# Patient Record
Sex: Male | Born: 1985 | Race: White | Hispanic: No | Marital: Married | Smoking: Never smoker
Health system: Southern US, Community
[De-identification: ages and names within clinical notes are randomized; demographics above are authoritative.]

## PROBLEM LIST (undated history)

## (undated) ENCOUNTER — Emergency Department (HOSPITAL_COMMUNITY)

## (undated) DIAGNOSIS — E781 Pure hyperglyceridemia: Secondary | ICD-10-CM

## (undated) DIAGNOSIS — E785 Hyperlipidemia, unspecified: Secondary | ICD-10-CM

## (undated) DIAGNOSIS — M109 Gout, unspecified: Secondary | ICD-10-CM

## (undated) HISTORY — PX: APPENDECTOMY: SHX54

## (undated) HISTORY — DX: Hyperlipidemia, unspecified: E78.5

## (undated) HISTORY — DX: Gout, unspecified: M10.9

## (undated) HISTORY — DX: Pure hyperglyceridemia: E78.1

---
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
## 2010-04-26 ENCOUNTER — Encounter: Admission: RE | Admit: 2010-04-26 | Discharge: 2010-04-26 | Payer: Self-pay | Admitting: Occupational Medicine

## 2010-05-27 ENCOUNTER — Encounter: Admission: RE | Admit: 2010-05-27 | Discharge: 2010-05-27 | Payer: Self-pay | Admitting: Occupational Medicine

## 2010-06-21 ENCOUNTER — Emergency Department (HOSPITAL_COMMUNITY): Admission: EM | Admit: 2010-06-21 | Discharge: 2010-06-21 | Payer: Self-pay | Admitting: Family Medicine

## 2013-03-13 ENCOUNTER — Telehealth: Payer: Self-pay

## 2013-03-13 NOTE — Telephone Encounter (Signed)
Pt states he seen Dr. Cleta Alberts about 4 years ago and was treated for Gout. He has recently has had a flare up again and is wanting to know if he could have something called in [facility] [address] Call back number is [phone]

## 2013-03-13 NOTE — Telephone Encounter (Signed)
Unfortunately can not do this without office visit. Have called him to advise.

## 2014-04-08 DIAGNOSIS — Y9301 Activity, walking, marching and hiking: Secondary | ICD-10-CM | POA: Insufficient documentation

## 2014-04-08 DIAGNOSIS — S9000XA Contusion of unspecified ankle, initial encounter: Secondary | ICD-10-CM | POA: Insufficient documentation

## 2014-04-08 DIAGNOSIS — S8990XA Unspecified injury of unspecified lower leg, initial encounter: Secondary | ICD-10-CM | POA: Insufficient documentation

## 2014-04-08 DIAGNOSIS — X500XXA Overexertion from strenuous movement or load, initial encounter: Secondary | ICD-10-CM | POA: Insufficient documentation

## 2014-04-08 DIAGNOSIS — S99929A Unspecified injury of unspecified foot, initial encounter: Principal | ICD-10-CM

## 2014-04-08 DIAGNOSIS — Y929 Unspecified place or not applicable: Secondary | ICD-10-CM | POA: Insufficient documentation

## 2014-04-08 DIAGNOSIS — S99919A Unspecified injury of unspecified ankle, initial encounter: Secondary | ICD-10-CM | POA: Insufficient documentation

## 2014-04-09 ENCOUNTER — Emergency Department (HOSPITAL_COMMUNITY): Payer: 59

## 2014-04-09 ENCOUNTER — Encounter (HOSPITAL_COMMUNITY): Payer: Self-pay | Admitting: Emergency Medicine

## 2014-04-09 ENCOUNTER — Emergency Department (HOSPITAL_COMMUNITY)
Admission: EM | Admit: 2014-04-09 | Discharge: 2014-04-09 | Disposition: A | Discharge status: Home / Self Care | Payer: 59 | Attending: Emergency Medicine | Admitting: Emergency Medicine

## 2014-04-09 DIAGNOSIS — M25572 Pain in left ankle and joints of left foot: Secondary | ICD-10-CM

## 2014-04-09 MED ORDER — HYDROCODONE-ACETAMINOPHEN 5-325 MG PO TABS
1.0000 | ORAL_TABLET | Freq: Once | ORAL | Status: AC
  Administered 2014-04-09: 1 via ORAL
  Filled 2014-04-09: qty 1

## 2014-04-09 NOTE — ED Notes (Signed)
Patient transported to X-ray 

## 2014-04-09 NOTE — ED Notes (Signed)
Pt reports that he stepped off a ramp and twisted L ankle. Ankle appears swollen and reports toes are starting to go numb. PMS intact.

## 2014-04-09 NOTE — ED Provider Notes (Signed)
CSN: [id number]     Arrival date & time 04/08/14  2330 History   First MD Initiated Contact with Patient 04/09/14 [phone]     Chief Complaint  Patient presents with  . Ankle Pain   HPI  Tyler Best is a 28 y.o. male with no PMH who presents to the ED for evaluation of ankle pain. History was provided by the patient. Patient states he was walking down the stairs and missed a step, resulting in the twisted of his left ankle this evening around 10 pm. Denies any other injuries or trauma. No head injury or LOC. Patient complains of constant left aching lateral ankle pain. Pain worse with movement. Nothing taken for pain PTA. Has had multiple left ankle injuries in the past. He complains of mild numbness but denies any weakness or loss of sensation.    History reviewed. No pertinent past medical history. Past Surgical History  Procedure Laterality Date  . Appendectomy     No family history on file. History  Substance Use Topics  . Smoking status: Never Smoker   . Smokeless tobacco: Not on file  . Alcohol Use: Yes    Review of Systems  Constitutional: Negative for fever, activity change and appetite change.  Cardiovascular: Negative for leg swelling.  Musculoskeletal: Positive for arthralgias, gait problem (due to pain) and joint swelling. Negative for back pain, myalgias and neck pain.  Skin: Positive for color change. Negative for wound.  Neurological: Positive for numbness. Negative for weakness, light-headedness and headaches.     Allergies  Review of patient's allergies indicates no known allergies.  Home Medications   Prior to Admission medications   Not on File   BP 122/73  Pulse 85  Temp(Src) 98.2 F (36.8 C) (Oral)  Resp 14  SpO2 100%  Filed Vitals:   04/09/14 0033 04/09/14 0225  BP: 122/73   Pulse: 67 85  Temp: 98.1 F (36.7 C) 98.2 F (36.8 C)  TempSrc: Oral Oral  Resp: 18 14  SpO2: 96% 100%    Physical Exam  Nursing note and vitals  reviewed. Constitutional: He is oriented to person, place, and time. He appears well-developed and well-nourished. No distress.  HENT:  Head: Normocephalic and atraumatic.  Right Ear: External ear normal.  Left Ear: External ear normal.  Mouth/Throat: Oropharynx is clear and moist.  Eyes: Conjunctivae are normal. Right eye exhibits no discharge. Left eye exhibits no discharge.  Neck: Normal range of motion. Neck supple.  Cardiovascular: Normal rate.   Dorsalis pedis pulses present and equal bilaterally  Pulmonary/Chest: Effort normal.  Abdominal: Soft.  Musculoskeletal: Normal range of motion. He exhibits edema and tenderness.       Feet:  Tenderness to palpation to the left lateral malleolus with associated edema and ecchymosis. No tenderness to palpation to the left foot, calf, or knee throughout. No wounds, lacerations or erythema. ROM limited in the ankle due to pain. ROM of the left digits intact.   Neurological: He is alert and oriented to person, place, and time.  Sensation intact in the LE bilaterally  Skin: Skin is warm and dry. He is not diaphoretic.     ED Course  Procedures (including critical care time) Labs Review Labs Reviewed - No data to display  Imaging Review Dg Ankle Complete Left  04/09/2014   CLINICAL DATA:  LEFT ankle pain and swelling, twist injury today  EXAM: LEFT ANKLE COMPLETE - 3+ VIEW  COMPARISON:  None  FINDINGS: Lateral soft tissue swelling  extending anteriorly.  Ankle mortise intact.  Corticated ossicle adjacent to tip of medial malleolus.  No acute fracture, dislocation, or bone destruction.  IMPRESSION: No acute osseous findings.   Electronically Signed   By: Ulyses SouthwardMark  Boles M.D.   On: 04/09/2014 02:02     EKG Interpretation None      MDM   Tyler Best is a 28 y.o. male with no PMH who presents to the ED for evaluation of ankle pain. Etiology of ankle pain possibly due to sprain. X-rays negative for fracture or malalignment. Patient  neurovascularly intact. Given ankle splint and crutches. RICE method discussed. Follow-up if symptoms not improving or worsening. Return precautions, discharge instructions, and follow-up was discussed with the patient before discharge.     There are no discharge medications for this patient.   Final impressions: 1. Ankle pain, left       Greer EeJessica Katlin Christia Domke PA-C            Jillyn LedgerJessica K Woodford Strege, PA-C 04/10/14 1250

## 2014-04-09 NOTE — Discharge Instructions (Signed)
Take vicodin for severe pain - Please be careful with this medication.  It can cause drowsiness.  Use caution while driving, operating machinery, drinking alcohol, or any other activities that may impair your physical or mental abilities.   Continue Ibuprofen  Use RICE method - see below Return to the emergency department if you develop any changing/worsening condition or any other concerns (please read additional information regarding your condition below)   Ankle Pain Ankle pain is a common symptom. The bones, cartilage, tendons, and muscles of the ankle joint perform a lot of work each day. The ankle joint holds your body weight and allows you to move around. Ankle pain can occur on either side or back of 1 or both ankles. Ankle pain may be sharp and burning or dull and aching. There may be tenderness, stiffness, redness, or warmth around the ankle. The pain occurs more often when a person walks or puts pressure on the ankle. CAUSES  There are many reasons ankle pain can develop. It is important to work with your caregiver to identify the cause since many conditions can impact the bones, cartilage, muscles, and tendons. Causes for ankle pain include:  Injury, including a break (fracture), sprain, or strain often due to a fall, sports, or a high-impact activity.  Swelling (inflammation) of a tendon (tendonitis).  Achilles tendon rupture.  Ankle instability after repeated sprains and strains.  Poor foot alignment.  Pressure on a nerve (tarsal tunnel syndrome).  Arthritis in the ankle or the lining of the ankle.  Crystal formation in the ankle (gout or pseudogout). DIAGNOSIS  A diagnosis is based on your medical history, your symptoms, results of your physical exam, and results of diagnostic tests. Diagnostic tests may include X-ray exams or a computerized magnetic scan (magnetic resonance imaging, MRI). TREATMENT  Treatment will depend on the cause of your ankle pain and may  include:  Keeping pressure off the ankle and limiting activities.  Using crutches or other walking support (a cane or brace).  Using rest, ice, compression, and elevation.  Participating in physical therapy or home exercises.  Wearing shoe inserts or special shoes.  Losing weight.  Taking medications to reduce pain or swelling or receiving an injection.  Undergoing surgery. HOME CARE INSTRUCTIONS   Only take over-the-counter or prescription medicines for pain, discomfort, or fever as directed by your caregiver.  Put ice on the injured area.  Put ice in a plastic bag.  Place a towel between your skin and the bag.  Leave the ice on for 15-20 minutes at a time, 03-04 times a day.  Keep your leg raised (elevated) when possible to lessen swelling.  Avoid activities that cause ankle pain.  Follow specific exercises as directed by your caregiver.  Record how often you have ankle pain, the location of the pain, and what it feels like. This information may be helpful to you and your caregiver.  Ask your caregiver about returning to work or sports and whether you should drive.  Follow up with your caregiver for further examination, therapy, or testing as directed. SEEK MEDICAL CARE IF:   Pain or swelling continues or worsens beyond 1 week.  You have an oral temperature above 102 F (38.9 C).  You are feeling unwell or have chills.  You are having an increasingly difficult time with walking.  You have loss of sensation or other new symptoms.  You have questions or concerns. MAKE SURE YOU:   Understand these instructions.  Will watch your  condition.  Will get help right away if you are not doing well or get worse. Document Released: 04/20/2010 Document Revised: 01/23/2012 Document Reviewed: 04/20/2010 [facility] Patient Information 2014 [place], [place].  RICE: Routine Care for Injuries The routine care of many injuries includes Rest, Ice, Compression, and Elevation  (RICE). HOME CARE INSTRUCTIONS  Rest is needed to allow your body to heal. Routine activities can usually be resumed when comfortable. Injured tendons and bones can take up to 6 weeks to heal. Tendons are the cord-like structures that attach muscle to bone.  Ice following an injury helps keep the swelling down and reduces pain.  Put ice in a plastic bag.  Place a towel between your skin and the bag.  Leave the ice on for 15-20 minutes, 03-04 times a day. Do this while awake, for the first 24 to 48 hours. After that, continue as directed by your caregiver.  Compression helps keep swelling down. It also gives support and helps with discomfort. If an elastic bandage has been applied, it should be removed and reapplied every 3 to 4 hours. It should not be applied tightly, but firmly enough to keep swelling down. Watch fingers or toes for swelling, bluish discoloration, coldness, numbness, or excessive pain. If any of these problems occur, remove the bandage and reapply loosely. Contact your caregiver if these problems continue.  Elevation helps reduce swelling and decreases pain. With extremities, such as the arms, hands, legs, and feet, the injured area should be placed near or above the level of the heart, if possible. SEEK IMMEDIATE MEDICAL CARE IF:  You have persistent pain and swelling.  You develop redness, numbness, or unexpected weakness.  Your symptoms are getting worse rather than improving after several days. These symptoms may indicate that further evaluation or further X-rays are needed. Sometimes, X-rays may not show a small broken bone (fracture) until 1 week or 10 days later. Make a follow-up appointment with your caregiver. Ask when your X-ray results will be ready. Make sure you get your X-ray results. Document Released: 02/12/2001 Document Revised: 01/23/2012 Document Reviewed: 04/01/2011 [facility] Patient Information 2014 [place], [place].   Emergency Department Resource  Guide 1) Find a Doctor and Pay Out of Pocket Although you won't have to find out who is covered by your insurance plan, it is a good idea to ask around and get recommendations. You will then need to call the office and see if the doctor you have chosen will accept you as a new patient and what types of options they offer for patients who are self-pay. Some doctors offer discounts or will set up payment plans for their patients who do not have insurance, but you will need to ask so you aren't surprised when you get to your appointment.  2) Contact Your Local Health Department Not all health departments have doctors that can see patients for sick visits, but many do, so it is worth a call to see if yours does. If you don't know where your local health department is, you can check in your phone book. The CDC also has a tool to help you locate your state's health department, and many state websites also have listings of all of their local health departments.  3) Find a Walk-in Clinic If your illness is not likely to be very severe or complicated, you may want to try a walk in clinic. These are popping up all over the country in pharmacies, drugstores, and shopping centers. They're usually staffed by nurse practitioners  or physician assistants that have been trained to treat common illnesses and complaints. They're usually fairly quick and inexpensive. However, if you have serious medical issues or chronic medical problems, these are probably not your best option.  No Primary Care Doctor: - Call Health Connect at  [phone] - they can help you locate a primary care doctor that  accepts your insurance, provides certain services, etc. - Physician Referral Service- [phone]  Chronic Pain Problems: Organization         Address  Phone   Notes  [facility]  [phone] Patients need to be referred by their primary care doctor.   Medication Assistance: Organization          Address  Phone   Notes  [facility] [address] [place] [place], [place] [address] [phone] --Must be a resident of [facility] -- Must have NO insurance coverage whatsoever (no Medicaid/ Medicare, etc.) -- The pt. MUST have a primary care doctor that directs their care regularly and follows them in the community   [facility]ist  [phone]   [employer]  [phone]    Agencies that provide inexpensive medical care: Organization         Address  Phone   Notes  [facility]  [phone]   [facility]    [phone]   [facility] [address] [place], [place] [address] [phone]   [facility] [address] [place] [place] [phone]   Planned Parenthood    [phone]   [facility]    [phone]   [facility]  [address], [place] Phone:  [phone], Fax:  [phone] Hours of Operation:  9 am - 6 pm, M-F.  Also accepts Medicaid/Medicare and self-pay.  [facility]  [address], [place] Phone: [phone], Fax: [phone]. Hours of Operation:  8:30 am - 5:30 pm, M-F.  Also accepts Medicaid and self-pay.  [facility] [place] [address] [place] Phone: [phone]   [facility] [address] Natasha Bence [place], [place] [phone], Ext. [phone] Mondays & Thursdays: 7-9 AM.  First 15 patients are seen on a first come, first serve basis.    Medicaid-accepting [facility] Providers:  Organization         Address  Phone   Notes  [facility] [address], [place] [phone] Also accepts self-pay patients.  [facility] [address] Laurell Josephs [place], [place]  [phone]   [facility] [address], [place] [phone]   [facility] [address], [place] [phone]   Renaye Rakers [address], [place]   [phone] Only accepts [place] Access [place] patients after they have their name applied to their card.   Self-Pay (no insurance) in [facility]:  Organization         Address  Phone   Notes  Sickle Cell Patients, [facility] [address] [place], [place] [phone]   [facility] [address] [place], [place] [phone]   [facility] [place]  [address], [place] [phone]   [facility]/Dr. Julio Sicks  [phone]  [address], [place] or [address], [place] [phone] Phone number for both [place] and [place] locations is the same.  [facility] [address], [place] [phone]) [phone]   [facility] [place] [address] [place] [address], [place] or [address] [phone]) [phone]   [facility] [address], [place] [phone]) [phone], phone; [phone](336) [phone], fax Sees patients 1st and 3rd Saturday of every month.  Must not qualify for public or private insurance (i.e. Medicaid, Medicare, [place] Health Choice, Veterans' Benefits)  Household income should be no more than 200% of the poverty level The clinic cannot treat you if you are pregnant or think you are pregnant  Sexually transmitted diseases are not treated at the clinic.    Dental Care: Organization         Address  Phone  Notes  [facility] [address], [place] [phone] Accepts children up to age 30 who are enrolled in [place]icaid or [place] Health Choice; pregnant women with a Medicaid card; and children who have applied for Medicaid or [place] Health Choice, but were declined, whose parents can pay a reduced fee at time of service.  [facility]  [place]  [address], [place] [phone] Accepts children up to age 30 who are enrolled in [place] or [place] Health Choice; pregnant women with a Medicaid card; and children who have applied for Medicaid or [place] Health Choice, but were declined, whose parents can pay a reduced fee at time of service.  [facility]  [address], [place] [phone]) [phone] Patients are seen by appointment only. Walk-ins are not accepted. [facility] will see patients 30 years of age and older. Monday - Tuesday (8am-5pm) Most Wednesdays (8:30-5pm) $30 per visit, cash only  [facility]  [address], [facility] [place] [phone]) [phone] Patients are seen by appointment only. Walk-ins are not accepted. [facility] will see patients 30 years of age and older. One Wednesday Evening (Monthly: Volunteer Based).  $30 per visit, cash only  [employer] [facility] [facility]  [phone] for adults; Children under age 444, call Graduate Pediatric Dentistry at [phone]. Children aged 884-14, please call [phone] to request a pediatric application.  Dental services are provided in all areas of dental care including fillings, crowns and bridges, complete and partial dentures, implants, gum treatment, root canals, and extractions. Preventive care is also provided. Treatment is provided to both adults and children. Patients are selected via a lottery and there is often a waiting list.   [facility] [address], [place]  [phone]) [phone] [email or web address]   [facility] [address], [place], [place] [phone], Ext. [phone] Second and Fourth Thursday of each month, opens at 6:30 AM; Clinic ends at 9 AM.  Patients are seen on a first-come first-served basis, and a limited number are seen during each clinic.   [facility]  [address], [place], [place] [phone]   Eligibility Requirements You must  have lived in [place], [place], or [place] counties for at least the last three months.   You cannot be eligible for state or federal sponsored National Cityhealthcare insurance, including CIGNAVeterans Administration, [place]icaid, or [employer]Medicare.   You generally cannot be eligible for healthcare insurance through your employer.  How to apply: Eligibility screenings are held every Tuesday and Wednesday afternoon from 1:00 pm until 4:00 pm. You do not need an appointment for the interview!  [facility] [address] [place], [place]   [place]  [place] Department  [place]  [phone]    Behavioral Health Resources in the Community: Intensive Outpatient Programs Organization         Address  Phone  Notes  [place]. [address], [place], [place] [phone]   [facility] Outpatient [address], [place], [place]   ADS: Alcohol & Drug Svcs [address], [place], [place]   [place] [address]  [place], [place] or [phone]   Substance Abuse Resources Organization         Address  Phone  Notes  Alcohol and Drug Services  [phone]   [place]  [phone]   The [place]   Chinita Pester  [phone]   Residential & Outpatient Substance Abuse Program  [phone]   Psychological Services Organization         Address  Phone  Notes  [facility]  [place]  [phone]   [place] [address], [place] or [phone]    Mobile Crisis Teams Organization         Address  Phone  Notes  [facility], Mobile Crisis Care Unit  [phone]   [facility]  [address] [place], [place]   [place] Levels [address], [place] [phone]    Self-Help/Support Groups Organization         Address  Phone             Notes  [place]. of [place] - variety of support groups  Amelia Call for more information  Narcotics Anonymous (NA), [facility] [address], [employer] [place]  2 meetings at this location   Special educational needs teacher         Address  Phone  Notes  [facility] Residential Treatment [place],    [place]  1-(747)[phone]   [facility]  [address] [place] [address]69, [place], [place]   [place], [place] [phone] Admissions: 8am-3pm M-F  Incentives Substance [place] [address],    [place], [place] [phone]   [facility] [address] [place], [place], [place]   [facility] [address]  [place], [place]   [facility] Programs - Intensive Outpatient [address] Kristeen [address], [place], [place]   [facility] [facility] [place] [place].,  [place], [place] [phone] or [phone]   [facility] (RTS) [address] [place], [place] Accepts Medicaid  [facility] [place] [address]  [place] [phone] Substance Abuse/Addiction Treatment   [facility] Organization         Address  Phone  Notes  [facility]  [phone]   Domenic Schwab, PhD [address] Arlis Porta [place], [place]   [phone] or [phone]   [place], [place] [phone]   [facility] [address],  [place], [place] [phone] Insurance/Medicaid/sponsorship through [facility] and Families [address], [address] [place], [place] [phone] [place] [address]    [place], [place] [phone]    Dr. Adele Schilder  [phone]   [facility] Dept. 1) [address], [place] 2) [place] 3)  [place] [address], [place] [phone]  [phone]   [place] [phone] or [phone] (After Hours)

## 2014-04-10 NOTE — ED Provider Notes (Signed)
Medical screening examination/treatment/procedure(s) were performed by non-physician practitioner and as supervising physician I was immediately available for consultation/collaboration.   EKG Interpretation None        Enid Skeens, MD 04/10/14 [phone]

## 2016-03-01 ENCOUNTER — Telehealth: Payer: Self-pay

## 2016-03-01 NOTE — Telephone Encounter (Signed)
Called pt, he had some questions about gout. I advised him to come in to be seen so we can evaluate. Pt agreed and will find PCP or walk-in the urgent care.

## 2016-03-01 NOTE — Telephone Encounter (Signed)
Patient states he was diagnosed with gout by Dr. Cleta Albertsaub several years ago and asked if he could speak to Dr. Cleta Albertsaub. I asked what in regards to and patient states that he just wants Dr. Cleta Albertsaub to call him. Please call! [phone]

## 2016-05-19 ENCOUNTER — Ambulatory Visit: Payer: Self-pay | Admitting: Family Medicine

## 2016-05-19 DIAGNOSIS — Z0289 Encounter for other administrative examinations: Secondary | ICD-10-CM

## 2017-06-24 DIAGNOSIS — S20211A Contusion of right front wall of thorax, initial encounter: Secondary | ICD-10-CM | POA: Diagnosis not present

## 2017-09-13 DIAGNOSIS — Z0183 Encounter for blood typing: Secondary | ICD-10-CM | POA: Diagnosis not present

## 2018-01-18 DIAGNOSIS — M19072 Primary osteoarthritis, left ankle and foot: Secondary | ICD-10-CM | POA: Diagnosis not present

## 2018-01-18 DIAGNOSIS — M109 Gout, unspecified: Secondary | ICD-10-CM | POA: Diagnosis not present

## 2018-01-18 DIAGNOSIS — M7752 Other enthesopathy of left foot: Secondary | ICD-10-CM | POA: Diagnosis not present

## 2018-02-08 ENCOUNTER — Encounter: Payer: Self-pay | Admitting: Family Medicine

## 2018-02-08 ENCOUNTER — Ambulatory Visit: Payer: 59 | Admitting: Family Medicine

## 2018-02-08 VITALS — BP 118/72 | HR 68 | Temp 98.0°F | Ht 68.75 in | Wt 197.0 lb

## 2018-02-08 DIAGNOSIS — Z8739 Personal history of other diseases of the musculoskeletal system and connective tissue: Secondary | ICD-10-CM | POA: Diagnosis not present

## 2018-02-08 DIAGNOSIS — Z7689 Persons encountering health services in other specified circumstances: Secondary | ICD-10-CM | POA: Diagnosis not present

## 2018-02-08 MED ORDER — ALLOPURINOL 100 MG PO TABS: 100.0000 mg | ORAL_TABLET | Freq: Every day | ORAL | 0 refills | Status: DC

## 2018-02-08 NOTE — Progress Notes (Signed)
Patient presents to clinic today to establish care.  SUBJECTIVE: PMH: Patient is a 32 year old male with past medical history significant for gout.  In the past patient has used urgent cares for his health needs.  Patient states he just had a physical a few months ago.  Gout: -Patient endorses ongoing issues with gout typically affecting bilateral great toes. -Patient was seen at [facility] by Dr. Elijah Birk for his last gout flare. -At that time uric acid level was drawn and patient was given allopurinol 100 mg daily. -Pt was also seen by the city physician and advised to find a primary care provider. -Pt endorses flares maybe 3-4 times per year.  Occasionally a flare is so bad that he cannot walk. -Pt is concerned about having a flare as he is expecting a baby in May.  Pt is adamant that he does not have any flares. -Pt endorses drinking beer on a regular basis and eating red meat.  Allergies: NKDA  Past surgical history: Wisdom tooth extraction Appendectomy 1997  Social history: Patient is married.  He and his wife on May 2019.  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  Patient endorses using chewing tobacco 3-4 times per day.  Large can will last a few days.  Patient thought about quitting.  Patient endorses alcohol use.  Patient states he will not stop drinking beer.  Patient denies drug use.  Family medical history: Dad-DM MGF-lung cancer with metastasis   Health Maintenance: Dental --Dr. [facility] [place] [facility] Immunizations --influenza vaccine 2018  History reviewed. No pertinent past medical history.  Past Surgical History:  Procedure Laterality Date  . APPENDECTOMY      Current Outpatient Medications on File Prior to Visit  Medication Sig Dispense Refill  . allopurinol (ZYLOPRIM) 100 MG tablet Take 100 mg by mouth daily.     No current facility-administered medications on file prior to visit.      No Known Allergies  Family History  Problem Relation Age of Onset  . Diabetes Father   . Cancer Maternal Grandfather     Social History   Socioeconomic History  . Marital status: Married    Spouse name: Not on file  . Number of children: Not on file  . Years of education: Not on file  . Highest education level: Not on file  Occupational History  . Not on file  Social Needs  . Financial resource strain: Not on file  . Food insecurity:    Worry: Not on file    Inability: Not on file  . Transportation needs:    Medical: Not on file    Non-medical: Not on file  Tobacco Use  . Smoking status: Never Smoker  . Smokeless tobacco: Current User    Types: Snuff  Substance and Sexual Activity  . Alcohol use: Yes    Alcohol/week: 3.6 oz    Types: 6 Cans of beer per week  . Drug use: No  . Sexual activity: Yes    Birth control/protection: None  Lifestyle  . Physical activity:    Days per week: Not on file    Minutes per session: Not on file  . Stress: Not on file  Relationships  . Social connections:    Talks on phone: Not on file    Gets together: Not on file    Attends religious service: Not on file    Active member of club or organization: Not  on file    Attends meetings of clubs or organizations: Not on file    Relationship status: Not on file  . Intimate partner violence:    Fear of current or ex partner: Not on file    Emotionally abused: Not on file    Physically abused: Not on file    Forced sexual activity: Not on file  Other Topics Concern  . Not on file  Social History Narrative  . Not on file    ROS General: Denies fever, chills, night sweats, changes in weight, changes in appetite HEENT: Denies headaches, ear pain, changes in vision, rhinorrhea, sore throat CV: Denies CP, palpitations, SOB, orthopnea Pulm: Denies SOB, cough, wheezing GI: Denies abdominal pain, nausea, vomiting, diarrhea, constipation GU: Denies dysuria, hematuria, frequency,  vaginal discharge Msk: Denies muscle cramps, joint pains  +h/o gout Neuro: Denies weakness, numbness, tingling Skin: Denies rashes, bruising Psych: Denies depression, anxiety, hallucinations  BP 118/72 (BP Location: Left Arm, Patient Position: Sitting, Cuff Size: Normal)   Pulse 68   Temp 98 F (36.7 C) (Oral)   Ht 5' 8.75" (1.746 m)   Wt 197 lb (89.4 kg)   SpO2 98%   BMI 29.30 kg/m   Physical Exam Gen. Pleasant, well developed, well-nourished, in NAD HEENT - [place]/AT, PERRL, no scleral icterus, no nasal drainage, pharynx without erythema or exudate.  TMs normal bilaterally.  No cervical lymphadenopathy. Lungs: no use of accessory muscles, CTAB, no wheezes, rales or rhonchi Cardiovascular: RRR,  No r/g/m, no peripheral edema Abdomen: BS present, soft, nontender,nondistended Neuro:  A&Ox3, CN II-XII intact, normal gait Skin:  Warm, dry, intact, no lesions Psych: normal affect, mood appropriate  No results found for this or any previous visit (from the past 2160 hour(s)).  Assessment/Plan: History of gout  -Patient given a list of purine rich foods to avoid.  Patient refuses to stop drinking beer -Given handout on gout -Continue allopurinol daily - Plan: allopurinol (ZYLOPRIM) 100 MG tablet -Will obtain follow-up uric acid level in the next few months  Encounter to establish care -We reviewed the PMH, PSH, FH, SH, Meds and Allergies. -We provided refills for any medications we will prescribe as needed. -We addressed current concerns per orders and patient instructions. -We have asked for records for pertinent exams, studies, vaccines and notes from previous providers. -We have advised patient to follow up per instructions below.   Follow-up PRN in the next few months  Abbe AmsterdamShannon Reeanna Acri, MD

## 2018-02-08 NOTE — Patient Instructions (Signed)
Gout Gout is painful swelling that can occur in some of your joints. Gout is a type of arthritis. This condition is caused by having too much uric acid in your body. Uric acid is a chemical that forms when your body breaks down substances called purines. Purines are important for building body proteins. When your body has too much uric acid, sharp crystals can form and build up inside your joints. This causes pain and swelling. Gout attacks can happen quickly and be very painful (acute gout). Over time, the attacks can affect more joints and become more frequent (chronic gout). Gout can also cause uric acid to build up under your skin and inside your kidneys. What are the causes? This condition is caused by too much uric acid in your blood. This can occur because:  Your kidneys do not remove enough uric acid from your blood. This is the most common cause.  Your body makes too much uric acid. This can occur with some cancers and cancer treatments. It can also occur if your body is breaking down too many red blood cells (hemolytic anemia).  You eat too many foods that are high in purines. These foods include organ meats and some seafood. Alcohol, especially beer, is also high in purines.  A gout attack may be triggered by trauma or stress. What increases the risk? This condition is more likely to develop in people who:  Have a family history of gout.  Are male and middle-aged.  Are male and have gone through menopause.  Are obese.  Frequently drink alcohol, especially beer.  Are dehydrated.  Lose weight too quickly.  Have an organ transplant.  Have lead poisoning.  Take certain medicines, including aspirin, cyclosporine, diuretics, levodopa, and niacin.  Have kidney disease or psoriasis.  What are the signs or symptoms? An attack of acute gout happens quickly. It usually occurs in just one joint. The most common place is the big toe. Attacks often start at night. Other joints  that may be affected include joints of the feet, ankle, knee, fingers, wrist, or elbow. Symptoms may include:  Severe pain.  Warmth.  Swelling.  Stiffness.  Tenderness. The affected joint may be very painful to touch.  Shiny, red, or purple skin.  Chills and fever.  Chronic gout may cause symptoms more frequently. More joints may be involved. You may also have white or yellow lumps (tophi) on your hands or feet or in other areas near your joints. How is this diagnosed? This condition is diagnosed based on your symptoms, medical history, and physical exam. You may have tests, such as:  Blood tests to measure uric acid levels.  Removal of joint fluid with a needle (aspiration) to look for uric acid crystals.  X-rays to look for joint damage.  How is this treated? Treatment for this condition has two phases: treating an acute attack and preventing future attacks. Acute gout treatment may include medicines to reduce pain and swelling, including:  NSAIDs.  Steroids. These are strong anti-inflammatory medicines that can be taken by mouth (orally) or injected into a joint.  Colchicine. This medicine relieves pain and swelling when it is taken soon after an attack. It can be given orally or through an IV tube.  Preventive treatment may include:  Daily use of smaller doses of NSAIDs or colchicine.  Use of a medicine that reduces uric acid levels in your blood.  Changes to your diet. You may need to see a specialist about healthy eating (dietitian).    Follow these instructions at home: During a Gout Attack  If directed, apply ice to the affected area: ? Put ice in a plastic bag. ? Place a towel between your skin and the bag. ? Leave the ice on for 20 minutes, 2-3 times a day.  Rest the joint as much as possible. If the affected joint is in your leg, you may be given crutches to use.  Raise (elevate) the affected joint above the level of your heart as often as  possible.  Drink enough fluids to keep your urine clear or pale yellow.  Take over-the-counter and prescription medicines only as told by your health care provider.  Do not drive or operate heavy machinery while taking prescription pain medicine.  Follow instructions from your health care provider about eating or drinking restrictions.  Return to your normal activities as told by your health care provider. Ask your health care provider what activities are safe for you. Avoiding Future Gout Attacks  Follow a low-purine diet as told by your dietitian or health care provider. Avoid foods and drinks that are high in purines, including liver, kidney, anchovies, asparagus, herring, mushrooms, mussels, and beer.  Limit alcohol intake to no more than 1 drink a day for nonpregnant women and 2 drinks a day for men. One drink equals 12 oz of beer, 5 oz of wine, or 1 oz of hard liquor.  Maintain a healthy weight or lose weight if you are overweight. If you want to lose weight, talk with your health care provider. It is important that you do not lose weight too quickly.  Start or maintain an exercise program as told by your health care provider.  Drink enough fluids to keep your urine clear or pale yellow.  Take over-the-counter and prescription medicines only as told by your health care provider.  Keep all follow-up visits as told by your health care provider. This is important. Contact a health care provider if:  You have another gout attack.  You continue to have symptoms of a gout attack after10 days of treatment.  You have side effects from your medicines.  You have chills or a fever.  You have burning pain when you urinate.  You have pain in your lower back or belly. Get help right away if:  You have severe or uncontrolled pain.  You cannot urinate. This information is not intended to replace advice given to you by your health care provider. Make sure you discuss any questions  you have with your health care provider. Document Released: 10/28/2000 Document Revised: 04/07/2016 Document Reviewed: 08/13/2015 Elsevier Interactive Patient Education  2018 Elsevier Inc. Low-Purine Diet Purines are compounds that affect the level of uric acid in your body. A low-purine diet is a diet that is low in purines. Eating a low-purine diet can prevent the level of uric acid in your body from getting too high and causing gout or kidney stones or both. What do I need to know about this diet?  Choose low-purine foods. Examples of low-purine foods are listed in the next section.  Drink plenty of fluids, especially water. Fluids can help remove uric acid from your body. Try to drink 8-16 cups (1.9-3.8 L) a day.  Limit foods high in fat, especially saturated fat, as fat makes it harder for the body to get rid of uric acid. Foods high in saturated fat include pizza, cheese, ice cream, whole milk, fried foods, and gravies. Choose foods that are lower in fat and lean sources   of protein. Use olive oil when cooking as it contains healthy fats that are not high in saturated fat.  Limit alcohol. Alcohol interferes with the elimination of uric acid from your body. If you are having a gout attack, avoid all alcohol.  Keep in mind that different people's bodies react differently to different foods. You will probably learn over time which foods do or do not affect you. If you discover that a food tends to cause your gout to flare up, avoid eating that food. You can more freely enjoy foods that do not cause problems. If you have any questions about a food item, talk to your dietitian or health care provider. Which foods are low, moderate, and high in purines? The following is a list of foods that are low, moderate, and high in purines. You can eat any amount of the foods that are low in purines. You may be able to have small amounts of foods that are moderate in purines. Ask your health care provider how  much of a food moderate in purines you can have. Avoid foods high in purines. Grains  Foods low in purines: Enriched white bread, pasta, rice, cake, cornbread, popcorn.  Foods moderate in purines: Whole-grain breads and cereals, wheat germ, bran, oatmeal. Uncooked oatmeal. Dry wheat bran or wheat germ.  Foods high in purines: Pancakes, French toast, biscuits, muffins. Vegetables  Foods low in purines: All vegetables, except those that are moderate in purines.  Foods moderate in purines: Asparagus, cauliflower, spinach, mushrooms, green peas. Fruits  All fruits are low in purines. Meats and other Protein Foods  Foods low in purines: Eggs, nuts, peanut butter.  Foods moderate in purines: 80-90% lean beef, lamb, veal, pork, poultry, fish, eggs, peanut butter, nuts. Crab, lobster, oysters, and shrimp. Cooked dried beans, peas, and lentils.  Foods high in purines: Anchovies, sardines, herring, mussels, tuna, codfish, scallops, trout, and haddock. Bacon. Organ meats (such as liver or kidney). [place]. Game meat. Goose. Sweetbreads. Dairy  All dairy foods are low in purines. Low-fat and fat-free dairy products are best because they are low in saturated fat. Beverages  Drinks low in purines: Water, carbonated beverages, tea, coffee, cocoa.  Drinks moderate in purines: Soft drinks and other drinks sweetened with high-fructose corn syrup. Juices. To find whether a food or drink is sweetened with high-fructose corn syrup, look at the ingredients list.  Drinks high in purines: Alcoholic beverages (such as beer). Condiments  Foods low in purines: Salt, herbs, olives, pickles, relishes, vinegar.  Foods moderate in purines: Butter, margarine, oils, mayonnaise. Fats and Oils  Foods low in purines: All types, except gravies and sauces made with meat.  Foods high in purines: Gravies and sauces made with meat. Other Foods  Foods low in purines: Sugars, sweets, gelatin. Cake. Soups made  without meat.  Foods moderate in purines: Meat-based or fish-based soups, broths, or bouillons. Foods and drinks sweetened with high-fructose corn syrup.  Foods high in purines: High-fat desserts (such as ice cream, cookies, cakes, pies, doughnuts, and chocolate). Contact your dietitian for more information on foods that are not listed here. This information is not intended to replace advice given to you by your health care provider. Make sure you discuss any questions you have with your health care provider. Document Released: 02/25/2011 Document Revised: 04/07/2016 Document Reviewed: 10/07/2013 Elsevier Interactive Patient Education  2017 Elsevier Inc.  

## 2018-02-27 DIAGNOSIS — S61209A Unspecified open wound of unspecified finger without damage to nail, initial encounter: Secondary | ICD-10-CM | POA: Diagnosis not present

## 2018-02-27 DIAGNOSIS — M79644 Pain in right finger(s): Secondary | ICD-10-CM | POA: Diagnosis not present

## 2018-05-09 ENCOUNTER — Ambulatory Visit: Payer: 59 | Admitting: Family Medicine

## 2018-05-09 DIAGNOSIS — Z0289 Encounter for other administrative examinations: Secondary | ICD-10-CM

## 2018-08-13 ENCOUNTER — Other Ambulatory Visit: Payer: Self-pay | Admitting: Family Medicine

## 2018-08-13 DIAGNOSIS — Z8739 Personal history of other diseases of the musculoskeletal system and connective tissue: Secondary | ICD-10-CM

## 2019-03-15 ENCOUNTER — Other Ambulatory Visit: Payer: Self-pay

## 2019-03-15 ENCOUNTER — Ambulatory Visit (INDEPENDENT_AMBULATORY_CARE_PROVIDER_SITE_OTHER): Payer: 59 | Admitting: Family Medicine

## 2019-03-15 ENCOUNTER — Encounter: Payer: Self-pay | Admitting: Family Medicine

## 2019-03-15 DIAGNOSIS — E7800 Pure hypercholesterolemia, unspecified: Secondary | ICD-10-CM | POA: Diagnosis not present

## 2019-03-15 DIAGNOSIS — R945 Abnormal results of liver function studies: Secondary | ICD-10-CM | POA: Diagnosis not present

## 2019-03-15 DIAGNOSIS — R7989 Other specified abnormal findings of blood chemistry: Secondary | ICD-10-CM

## 2019-03-15 NOTE — Progress Notes (Signed)
Virtual Visit via Video Note  I connected with Tyler Best on 03/15/19 at  8:00 AM EDT by a video enabled telemedicine application and verified that I am speaking with the correct person using two identifiers.  Location patient: home Location provider:work or home office Persons participating in the virtual visit: patient, provider  I discussed the limitations of evaluation and management by telemedicine and the availability of in person appointments. The patient expressed understanding and agreed to proceed.   HPI: Pt works for the [employer]. He had labs 01/03/19 for annual work physical.  Pt's total cholesterol was 242 and LDL 153.  AST was 51 and ALT 84.  Pt worried about his liver function.  Pt denies abd pain, N/V, jaundice, fatigue, itching.  Pt may drink 5-6 beers on his days off.  Pt occasionally eats healthy.  Pt denies changes in meds, taking allopurinol daily for gout and may take OTC heartburn med. Denies tylenol use.   ROS: See pertinent positives and negatives per HPI.  No past medical history on file.  Past Surgical History:  Procedure Laterality Date  . APPENDECTOMY      Family History  Problem Relation Age of Onset  . Diabetes Father   . Cancer Maternal Grandfather     SOCIAL HX: Pt is married. Pt also works at [employer] when not at the [employer].  Pt has a 1 yo son.  Pt is currently on vacation from the [employer].  May go to the beach next wk.  Pt denies drug use.   Current Outpatient Medications:  .  allopurinol (ZYLOPRIM) 100 MG tablet, Take 100 mg by mouth daily., Disp: , Rfl:  .  allopurinol (ZYLOPRIM) 100 MG tablet, TAKE 1 TABLET BY MOUTH EVERY DAY, Disp: 30 tablet, Rfl: 2  EXAM:  VITALS per patient if applicable: RR between 12-20 bpm  GENERAL: alert, oriented, appears well and in no acute distress  HEENT: atraumatic, conjunctiva clear, no obvious abnormalities on inspection of external nose and ears  NECK: normal movements of the  head and neck  LUNGS: on inspection no signs of respiratory distress, breathing rate appears normal, no obvious gross SOB, gasping or wheezing  CV: no obvious cyanosis  MS: moves all visible extremities without noticeable abnormality  PSYCH/NEURO: pleasant and cooperative, no obvious depression or anxiety, speech and thought processing grossly intact  ASSESSMENT AND PLAN:  Discussed the following assessment and plan:  Elevated LFTs  -discussed various causes -pt strongly encouraged to decrease EtOH intake.   -diet changes also encouraged -will recheck CMP.  Pt to have labs done next Tues or Wed.  If LFTs remain elevated will obtain hepatic u/s. -continue to monitor - Plan: Comprehensive metabolic panel  Elevated cholesterol -discussed lifestyle modifications -recheck in 6 months    F/u prn based on labs.   I discussed the assessment and treatment plan with the patient. The patient was provided an opportunity to ask questions and all were answered. The patient agreed with the plan and demonstrated an understanding of the instructions.   The patient was advised to call back or seek an in-person evaluation if the symptoms worsen or if the condition fails to improve as anticipated.   Deeann Saint, MD

## 2019-03-20 ENCOUNTER — Other Ambulatory Visit (INDEPENDENT_AMBULATORY_CARE_PROVIDER_SITE_OTHER): Payer: 59

## 2019-03-20 ENCOUNTER — Other Ambulatory Visit: Payer: Self-pay | Admitting: Family Medicine

## 2019-03-20 ENCOUNTER — Other Ambulatory Visit: Payer: Self-pay

## 2019-03-20 DIAGNOSIS — R945 Abnormal results of liver function studies: Secondary | ICD-10-CM

## 2019-03-20 DIAGNOSIS — R7989 Other specified abnormal findings of blood chemistry: Secondary | ICD-10-CM

## 2019-03-20 LAB — COMPREHENSIVE METABOLIC PANEL
ALT: 97 U/L — ABNORMAL HIGH (ref 0–53)
AST: 68 U/L — ABNORMAL HIGH (ref 0–37)
Albumin: 4.7 g/dL (ref 3.5–5.2)
Alkaline Phosphatase: 70 U/L (ref 39–117)
BUN: 15 mg/dL (ref 6–23)
CO2: 28 mEq/L (ref 19–32)
Calcium: 9.3 mg/dL (ref 8.4–10.5)
Chloride: 96 mEq/L (ref 96–112)
Creatinine, Ser: 1.23 mg/dL (ref 0.40–1.50)
GFR: 66.85 mL/min (ref >60.00)
Glucose, Bld: 83 mg/dL (ref 70–99)
Potassium: 3.9 mEq/L (ref 3.5–5.1)
Sodium: 135 mEq/L (ref 135–145)
Total Bilirubin: 1 mg/dL (ref 0.2–1.2)
Total Protein: 7.1 g/dL (ref 6.0–8.3)

## 2019-04-11 ENCOUNTER — Ambulatory Visit
Admission: RE | Admit: 2019-04-11 | Discharge: 2019-04-11 | Disposition: A | Discharge status: Home / Self Care | Payer: 59 | Source: Ambulatory Visit | Attending: Family Medicine | Admitting: Family Medicine

## 2019-04-11 DIAGNOSIS — R7989 Other specified abnormal findings of blood chemistry: Secondary | ICD-10-CM

## 2019-04-16 ENCOUNTER — Telehealth: Payer: Self-pay

## 2019-04-16 NOTE — Telephone Encounter (Signed)
Pt was given U/S results and verbalized understanding.  Copied from CRM [id number]. Topic: General - Inquiry >> Apr 12, 2019  2:47 PM Baldo Daub L wrote: Reason for CRM:   Pt calling to find out results of ultrasound done yesterday. Pt can be reached at [phone]

## 2020-04-08 ENCOUNTER — Telehealth: Payer: Self-pay | Admitting: Family Medicine

## 2020-04-08 NOTE — Telephone Encounter (Signed)
Pt states that he has gout in both his feet and that he has spoken to Dr. Salomon Fick before about gout so she prescribed him allopurinol for it. He is wondering if it can be refilled. He stated he has a kid at the age of [age] and is unable to wait. Pt states it is urgent.   Last OV: 03/15/2019 I tried to offer the pt an appt for tomorrow but pt declined and wants to send the message back and speak to either CMA/PCP today. I informed the pt that I cannot guarantee he will received a call back today or that she will refill the prescription without being seen.   Pharmacy: [facility] [address] FAX: [phone]   Pt can be reached at [phone]

## 2020-04-09 NOTE — Telephone Encounter (Signed)
Please advise if ok to send refills since pt declined office appointment for his concerns.

## 2020-04-10 ENCOUNTER — Other Ambulatory Visit: Payer: Self-pay | Admitting: Family Medicine

## 2020-04-10 DIAGNOSIS — M109 Gout, unspecified: Secondary | ICD-10-CM

## 2020-04-10 MED ORDER — PREDNISONE 10 MG PO TABS: ORAL_TABLET | ORAL | 0 refills | Status: DC

## 2020-04-10 NOTE — Telephone Encounter (Signed)
Rx for prednisone sent to patient's pharmacy.  In the future patient needs to be seen for all other requests or issues.

## 2020-04-10 NOTE — Telephone Encounter (Signed)
Left a detailed message for pt regarding new Rx for Prednisone and Dr Salomon Fick recommendation

## 2020-04-14 ENCOUNTER — Other Ambulatory Visit: Payer: Self-pay

## 2020-04-14 ENCOUNTER — Encounter: Payer: Self-pay | Admitting: Family Medicine

## 2020-04-14 ENCOUNTER — Ambulatory Visit (INDEPENDENT_AMBULATORY_CARE_PROVIDER_SITE_OTHER): Payer: 59 | Admitting: Family Medicine

## 2020-04-14 DIAGNOSIS — M109 Gout, unspecified: Secondary | ICD-10-CM | POA: Insufficient documentation

## 2020-04-14 DIAGNOSIS — M1A0790 Idiopathic chronic gout, unspecified ankle and foot, without tophus (tophi): Secondary | ICD-10-CM | POA: Diagnosis not present

## 2020-04-14 MED ORDER — COLCHICINE 0.6 MG PO TABS: 0.6000 mg | ORAL_TABLET | Freq: Two times a day (BID) | ORAL | 1 refills | Status: DC | PRN

## 2020-04-14 MED ORDER — PREDNISONE 20 MG PO TABS: ORAL_TABLET | ORAL | 0 refills | Status: DC

## 2020-04-14 NOTE — Patient Instructions (Signed)
Gout  Gout is a condition that causes painful swelling of the joints. Gout is a type of inflammation of the joints (arthritis). This condition is caused by having too much uric acid in the body. Uric acid is a chemical that forms when the body breaks down substances called purines. Purines are important for building body proteins. When the body has too much uric acid, sharp crystals can form and build up inside the joints. This causes pain and swelling. Gout attacks can happen quickly and may be very painful (acute gout). Over time, the attacks can affect more joints and become more frequent (chronic gout). Gout can also cause uric acid to build up under the skin and inside the kidneys. What are the causes? This condition is caused by too much uric acid in your blood. This can happen because:  Your kidneys do not remove enough uric acid from your blood. This is the most common cause.  Your body makes too much uric acid. This can happen with some cancers and cancer treatments. It can also occur if your body is breaking down too many red blood cells (hemolytic anemia).  You eat too many foods that are high in purines. These foods include organ meats and some seafood. Alcohol, especially beer, is also high in purines. A gout attack may be triggered by trauma or stress. What increases the risk? You are more likely to develop this condition if you:  Have a family history of gout.  Are male and middle-aged.  Are male and have gone through menopause.  Are obese.  Frequently drink alcohol, especially beer.  Are dehydrated.  Lose weight too quickly.  Have an organ transplant.  Have lead poisoning.  Take certain medicines, including aspirin, cyclosporine, diuretics, levodopa, and niacin.  Have kidney disease.  Have a skin condition called psoriasis. What are the signs or symptoms? An attack of acute gout happens quickly. It usually occurs in just one joint. The most common place is  the big toe. Attacks often start at night. Other joints that may be affected include joints of the feet, ankle, knee, fingers, wrist, or elbow. Symptoms of this condition may include:  Severe pain.  Warmth.  Swelling.  Stiffness.  Tenderness. The affected joint may be very painful to touch.  Shiny, red, or purple skin.  Chills and fever. Chronic gout may cause symptoms more frequently. More joints may be involved. You may also have white or yellow lumps (tophi) on your hands or feet or in other areas near your joints. How is this diagnosed? This condition is diagnosed based on your symptoms, medical history, and physical exam. You may have tests, such as:  Blood tests to measure uric acid levels.  Removal of joint fluid with a thin needle (aspiration) to look for uric acid crystals.  X-rays to look for joint damage. How is this treated? Treatment for this condition has two phases: treating an acute attack and preventing future attacks. Acute gout treatment may include medicines to reduce pain and swelling, including:  NSAIDs.  Steroids. These are strong anti-inflammatory medicines that can be taken by mouth (orally) or injected into a joint.  Colchicine. This medicine relieves pain and swelling when it is taken soon after an attack. It can be given by mouth or through an IV. Preventive treatment may include:  Daily use of smaller doses of NSAIDs or colchicine.  Use of a medicine that reduces uric acid levels in your blood.  Changes to your diet. You may   need to see a dietitian about what to eat and drink to prevent gout. Follow these instructions at home: During a gout attack   If directed, put ice on the affected area: ? Put ice in a plastic bag. ? Place a towel between your skin and the bag. ? Leave the ice on for 20 minutes, 2-3 times a day.  Raise (elevate) the affected joint above the level of your heart as often as possible.  Rest the joint as much as possible.  If the affected joint is in your leg, you may be given crutches to use.  Follow instructions from your health care provider about eating or drinking restrictions. Avoiding future gout attacks  Follow a low-purine diet as told by your dietitian or health care provider. Avoid foods and drinks that are high in purines, including liver, kidney, anchovies, asparagus, herring, mushrooms, mussels, and beer.  Maintain a healthy weight or lose weight if you are overweight. If you want to lose weight, talk with your health care provider. It is important that you do not lose weight too quickly.  Start or maintain an exercise program as told by your health care provider. Eating and drinking  Drink enough fluids to keep your urine pale yellow.  If you drink alcohol: ? Limit how much you use to:  0-1 drink a day for women.  0-2 drinks a day for men. ? Be aware of how much alcohol is in your drink. In the [place] one drink equals one 12 oz bottle of beer (355 mL) one 5 oz glass of wine (148 mL), or one 1 oz glass of hard liquor (44 mL). General instructions  Take over-the-counter and prescription medicines only as told by your health care provider.  Do not drive or use heavy machinery while taking prescription pain medicine.  Return to your normal activities as told by your health care provider. Ask your health care provider what activities are safe for you.  Keep all follow-up visits as told by your health care provider. This is important. Contact a health care provider if you have:  Another gout attack.  Continuing symptoms of a gout attack after 10 days of treatment.  Side effects from your medicines.  Chills or a fever.  Burning pain when you urinate.  Pain in your lower back or belly. Get help right away if you:  Have severe or uncontrolled pain.  Cannot urinate. Summary  Gout is painful swelling of the joints caused by inflammation.  The most common site of pain is the big  toe, but it can affect other joints in the body.  Medicines and dietary changes can help to prevent and treat gout attacks. This information is not intended to replace advice given to you by your health care provider. Make sure you discuss any questions you have with your health care provider. Document Revised: 05/23/2018 Document Reviewed: 05/23/2018 Elsevier Patient Education  2020 Elsevier Inc.  

## 2020-04-14 NOTE — Progress Notes (Signed)
  Subjective:     Patient ID: Tyler Best, male   DOB: Jul 21, 1986, 34 y.o.   MRN: [id number]  HPI   Ian Malkin has history of gout since around age 59.  He states he had involvement of the MTP joints of the feet in the past.  He started Friday with bilateral involvement.  No recent dietary changes.  He stays well-hydrated.  He was called in some prednisone starting at 40 mg daily on Friday and is currently on 30 mg dose.  He is not seen much improvement yet.  He is not aware of any clear dietary triggers.  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  No recent fevers or chills.  He apparently briefly took allopurinol for prophylaxis in the past but not currently  No past medical history on file. Past Surgical History:  Procedure Laterality Date  . APPENDECTOMY      reports that he has never smoked. His smokeless tobacco use includes snuff. He reports current alcohol use of about 6.0 standard drinks of alcohol per week. He reports that he does not use drugs. family history includes Cancer in his maternal grandfather; Diabetes in his father. No Known Allergies   Review of Systems  Constitutional: Negative for chills and fever.  Respiratory: Negative for cough and shortness of breath.   Musculoskeletal: Positive for arthralgias.       Objective:   Physical Exam Vitals reviewed.  Constitutional:      Appearance: Normal appearance.  Cardiovascular:     Rate and Rhythm: Normal rate and regular rhythm.  Musculoskeletal:     Comments: He has some mild erythema, slight warmth, mild swelling, and tenderness MTP joint of both feet.  Neurological:     Mental Status: He is alert.        Assessment:     Acute gout involving MTP joints of both feet.  Patient currently on prednisone 30 mg daily without much improvement    Plan:     -We discussed various triggers for acute gout -Continue to stay well-hydrated -Low purine diet -Bump prednisone back to 60 mg for 2 days then 40 mg  for 3 days and then discontinue -Colchicine 0.6 mg twice daily as needed for acute gout -If having more frequent flareups consider prophylaxis and will discuss with Dr. Salomon Fick after acute episode subsides  Kristian Covey MD [facility] at [facility]

## 2020-04-20 NOTE — Telephone Encounter (Signed)
Pt is currently on Colchicine 0.6 mg, ok to refill Allopurinol 100 mg

## 2020-04-23 ENCOUNTER — Telehealth: Payer: Self-pay | Admitting: Family Medicine

## 2020-04-23 NOTE — Telephone Encounter (Signed)
Pt call and want a refill on predniSONE (DELTASONE) 10 MG  He stated that he saw dr.Burchette last wk about the gout and he gave him the predniSONE, but the pain is not ask bad but not gone.

## 2020-04-23 NOTE — Telephone Encounter (Signed)
Pt had prednisone x 2 since 04/09/20.  Pt can take the colchicine that is for acute gout flares.  Pt can take 2 pills at first sign of flare, then one pill an hour later.  He can then take 1 pill daily thereafter, until the flare resolves.

## 2020-04-23 NOTE — Telephone Encounter (Signed)
Please advise 

## 2020-04-24 NOTE — Telephone Encounter (Signed)
Left a message for the pt to return my call.  

## 2020-04-28 NOTE — Telephone Encounter (Signed)
Spoke with pt verbalized of Dr Salomon Fick advise

## 2020-05-20 ENCOUNTER — Other Ambulatory Visit: Payer: Self-pay

## 2020-05-20 ENCOUNTER — Encounter: Payer: Self-pay | Admitting: Family Medicine

## 2020-05-20 ENCOUNTER — Ambulatory Visit: Payer: 59 | Admitting: Family Medicine

## 2020-05-20 VITALS — BP 120/80 | HR 110 | Temp 98.3°F | Ht 68.75 in | Wt 188.0 lb

## 2020-05-20 DIAGNOSIS — H60501 Unspecified acute noninfective otitis externa, right ear: Secondary | ICD-10-CM

## 2020-05-20 DIAGNOSIS — B360 Pityriasis versicolor: Secondary | ICD-10-CM

## 2020-05-20 MED ORDER — KETOCONAZOLE 200 MG PO TABS: 200.0000 mg | ORAL_TABLET | Freq: Two times a day (BID) | ORAL | 0 refills | Status: DC

## 2020-05-20 MED ORDER — CIPROFLOXACIN-DEXAMETHASONE 0.3-0.1 % OT SUSP: 4.0000 [drp] | Freq: Two times a day (BID) | OTIC | 0 refills | Status: DC

## 2020-05-20 NOTE — Progress Notes (Signed)
   Subjective:    Patient ID: Tyler Best, male    DOB: [birth year], 34 y.o.   MRN: [id number]  HPI Here for 4 days of pain and muffles hearing in the right ear. He did go swimming at a friend's pool over the weekend. He tried cleaning it with Q Tips and ear was removal kits. He also asks me to check his skin. About 3 years ago he developed light colored patches of skin on the trunk which itch during warm weather. He has tried rubbing in [employer] shampoo with no response.    Review of Systems  Constitutional: Negative.   HENT: Positive for ear pain and hearing loss. Negative for congestion, postnasal drip, sinus pressure, sinus pain and sore throat.   Eyes: Negative.   Respiratory: Negative.   Skin: Positive for color change.       Objective:   Physical Exam Constitutional:      Appearance: Normal appearance.  HENT:     Right Ear: Tympanic membrane normal.     Left Ear: Tympanic membrane, ear canal and external ear normal.     Ears:     Comments: Right canal is swollen and red     Nose: Nose normal.     Mouth/Throat:     Pharynx: Oropharynx is clear.  Eyes:     Conjunctiva/sclera: Conjunctivae normal.  Pulmonary:     Effort: Pulmonary effort is normal.     Breath sounds: Normal breath sounds.  Lymphadenopathy:     Cervical: No cervical adenopathy.  Skin:    Comments: There are wide macular patches of hypopigmented skin on the trunk   Neurological:     Mental Status: He is alert.           Assessment & Plan:  He has an otitis externa which we will treat with Ciprodex drops. He also has tinea versicolor which we will treat with Ketoconazole BID for 30 days. Recheck prn.  Gershon Crane, MD

## 2020-12-09 ENCOUNTER — Encounter: Payer: Self-pay | Admitting: Internal Medicine

## 2020-12-09 ENCOUNTER — Other Ambulatory Visit: Payer: Self-pay

## 2020-12-09 ENCOUNTER — Telehealth (INDEPENDENT_AMBULATORY_CARE_PROVIDER_SITE_OTHER): Payer: 59 | Admitting: Internal Medicine

## 2020-12-09 ENCOUNTER — Telehealth: Payer: Self-pay | Admitting: Family Medicine

## 2020-12-09 VITALS — Ht 68.0 in | Wt 192.0 lb

## 2020-12-09 DIAGNOSIS — Z79899 Other long term (current) drug therapy: Secondary | ICD-10-CM | POA: Diagnosis not present

## 2020-12-09 DIAGNOSIS — R7989 Other specified abnormal findings of blood chemistry: Secondary | ICD-10-CM

## 2020-12-09 DIAGNOSIS — M109 Gout, unspecified: Secondary | ICD-10-CM | POA: Diagnosis not present

## 2020-12-09 DIAGNOSIS — M1A0790 Idiopathic chronic gout, unspecified ankle and foot, without tophus (tophi): Secondary | ICD-10-CM

## 2020-12-09 MED ORDER — ALLOPURINOL 100 MG PO TABS: 100.0000 mg | ORAL_TABLET | Freq: Every day | ORAL | 1 refills | Status: DC

## 2020-12-09 MED ORDER — PREDNISONE 20 MG PO TABS: ORAL_TABLET | ORAL | 0 refills | Status: DC

## 2020-12-09 MED ORDER — COLCHICINE 0.6 MG PO TABS: 0.6000 mg | ORAL_TABLET | Freq: Two times a day (BID) | ORAL | 1 refills | Status: AC | PRN

## 2020-12-09 NOTE — Telephone Encounter (Signed)
The patient is needing this Rx refilled for his gout flare up. He said that this is the only thing that works with the   colchicine 0.6 MG tablet and the allopurinol (ZYLOPRIM) 100 MG tablet medication that he is already taking.   Needs refill for this Rx: predniSONE (DELTASONE) 20 MG tablet   [id number] - [place], [place] - [address] AT [facility] [address] Phone:  [phone]  Fax:  [phone]

## 2020-12-09 NOTE — Progress Notes (Signed)
   Virtual Visit via Telephone Note  I connected with@ on 12/09/20 at 12:30 PM EST by telephone and verified that I am speaking with the correct person using two identifiers.   I discussed the limitations, risks, security and privacy concerns of performing an evaluation and management service by telephone and the limited availability of in person appointments. tThere may be a patient responsible charge related to this service. The patient expressed understanding and agreed to proceed.  Location patient: home Location provider: work  office Participants present for the call: patient, provider Patient did not have a visit in the prior 7 days to address this/these issue(s).   History of Present Illness: Tyler Best    PCP NA today  Onset right great toe pain like typical gout today  Wants help before  Getting worse   inc episodes in past year  But classic sx   allopur 100 per day and colchicine bid prn.   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 I do not appreciate any SOB. Speech and thought processing are grossly intact. Patient reported vitals: Lab Results  Component Value Date   GLUCOSE 83 03/20/2019   ALT 97 (H) 03/20/2019   AST 68 (H) 03/20/2019   NA 135 03/20/2019   K 3.9 03/20/2019   CL 96 03/20/2019   CREATININE 1.23 03/20/2019   BUN 15 03/20/2019   CO2 28 03/20/2019    Assessment and Plan: Acute gout of foot, unspecified cause, unspecified laterality - Plan: Comprehensive metabolic panel, Uric acid, CBC with Differential/Platelet  Elevated LFTs - Plan: Comprehensive metabolic panel, Uric acid, CBC with Differential/Platelet  Chronic gout of foot, unspecified cause, unspecified laterality - Plan: Comprehensive metabolic panel, Uric acid, CBC with Differential/Platelet  Medication management - Plan: Comprehensive metabolic panel, Uric acid, CBC with  Differential/Platelet   pred today sounds classic  Gout and not infection May benefit from  Inc dose of suppressive rx  No recnet uric acid level done in ehr   Last last 2020   Plan    Lab  At [facility] lab I has  I unpredicatlbe schedule )  And then plan fu appt with PCP may be helped by inc dose allopurinal  Follow Up Instructions:  @   [id number] 5-10 [id number] 11-20 [id number] 21-30 I did not refer this patient for an OV in the next 24 hours for this/these issue(s).  I discussed the assessment and treatment plan with the patient. The patient was provided an opportunity to ask questions and answered. The patient agreed with the plan and demonstrated an understanding of the instructions.   The patient was advised to call back or seek an in-person evaluation if the symptoms worsen or if the condition fails to improve as anticipated.  I provided 18 minutes minutes of non-face-to-face time during this encounter. orders placed and plans  Return for labs in a few weeks at [facility]  then  fu appt PCP to evaluate.  Berniece Andreas, MD

## 2020-12-09 NOTE — Telephone Encounter (Signed)
Refills for colchicine and allopurinol sent to pt pharmacy for refill. Pt was seen this afternoon by Dr Fabian Sharp

## 2020-12-10 ENCOUNTER — Telehealth: Payer: Self-pay | Admitting: Family Medicine

## 2020-12-10 NOTE — Telephone Encounter (Signed)
Pt decline to sch for covid vaccine 

## 2021-02-07 IMAGING — US ULTRASOUND ABDOMEN LIMITED
1 series · 14 of 25 positions shown · non-contrast
Comparison: None.

CLINICAL DATA: Elevated liver enzymes

EXAM:
ULTRASOUND ABDOMEN LIMITED RIGHT UPPER QUADRANT

[Series 1: ultrasound abdomen limited · 0.19mm/px · 14 of 91 slices shown]
[im 1/91]
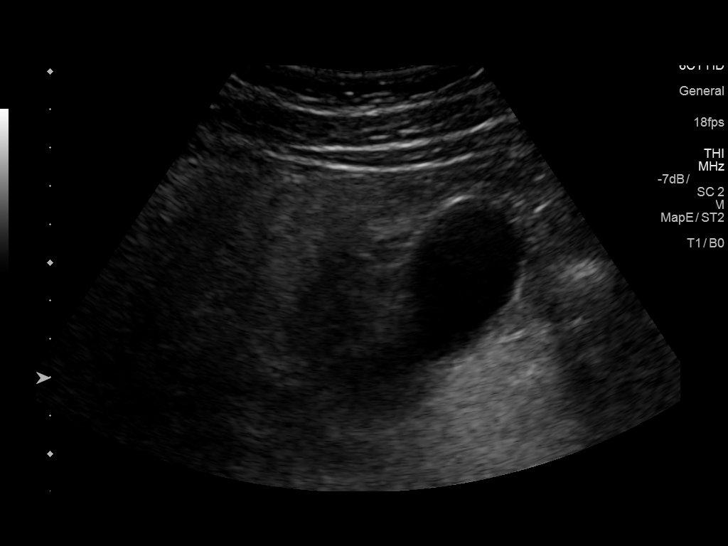
[im 8/91]
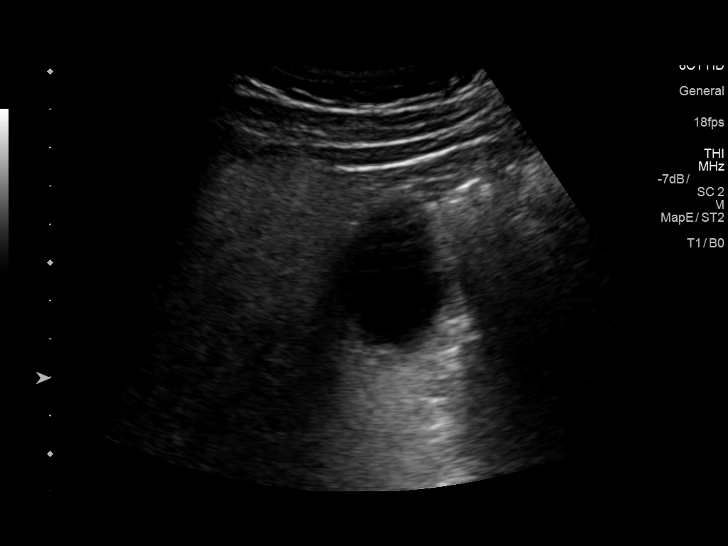
[im 16/91]
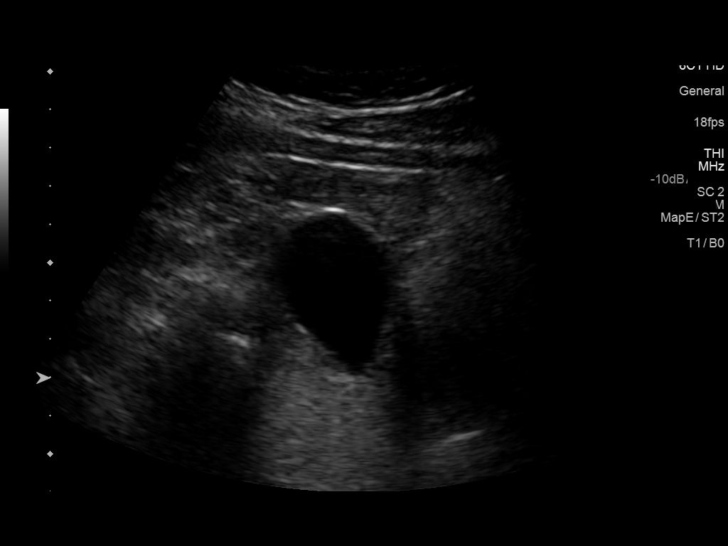
[im 23/91]
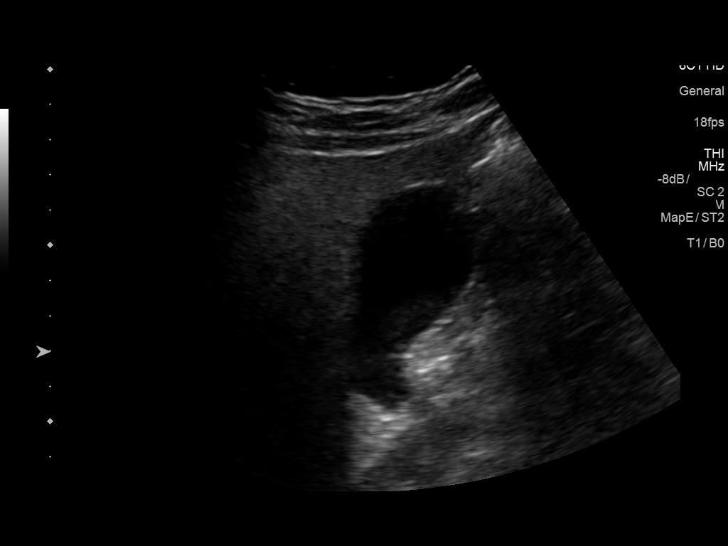
[im 31/91]
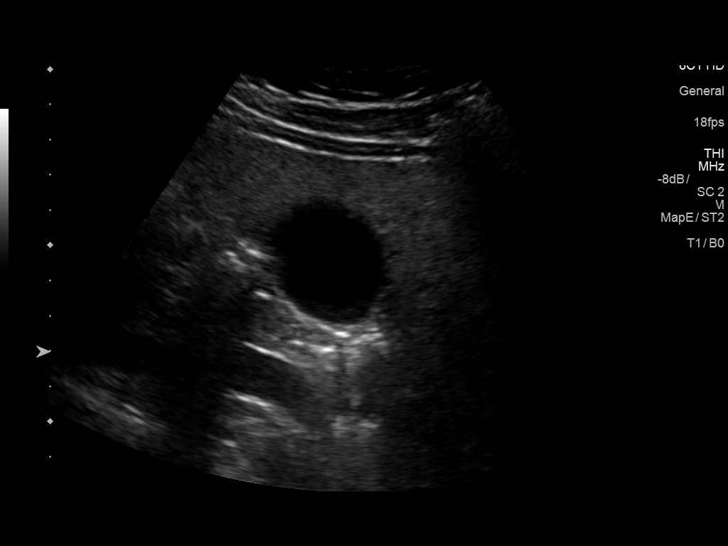
[im 34/91]
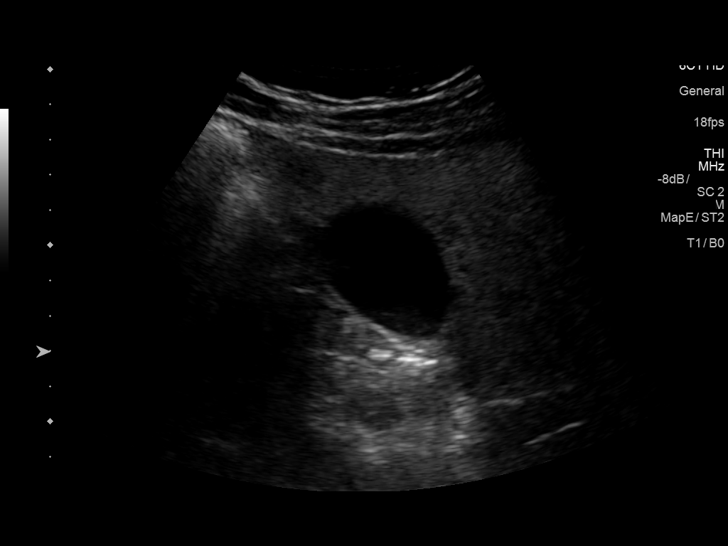
[im 42/91]
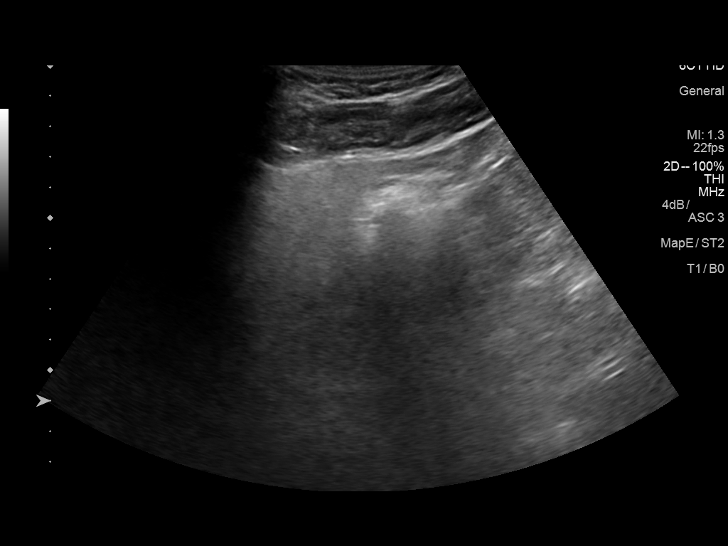
[im 49/91]
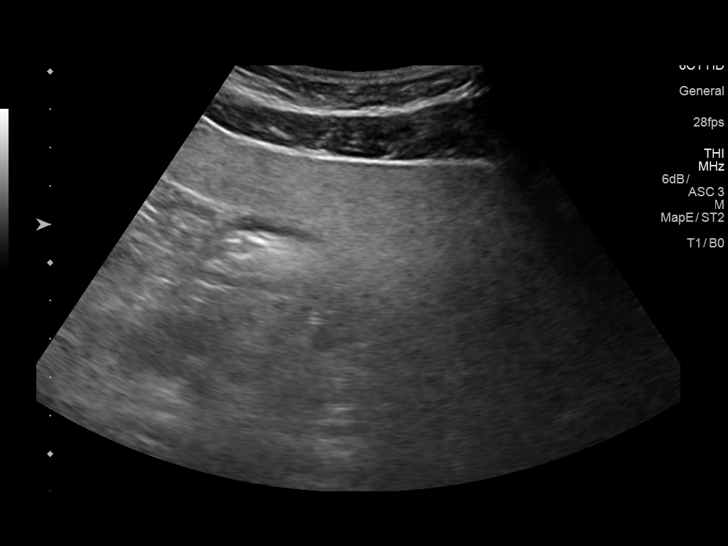
[im 57/91]
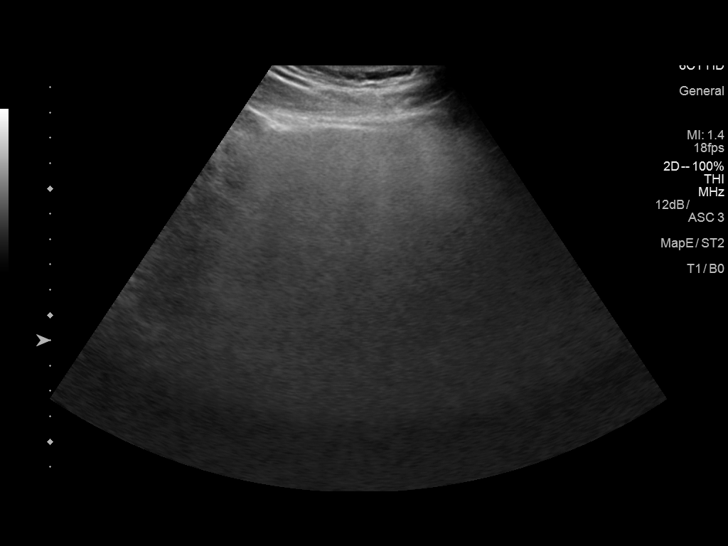
[im 61/91]
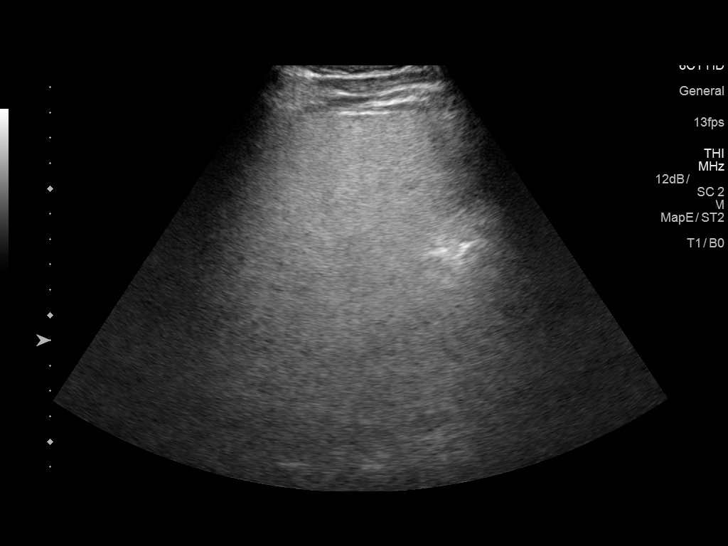
[im 68/91]
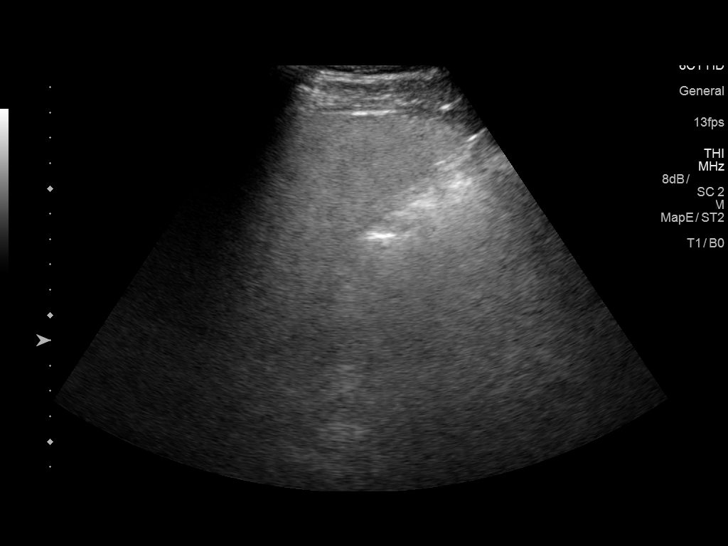
[im 76/91]
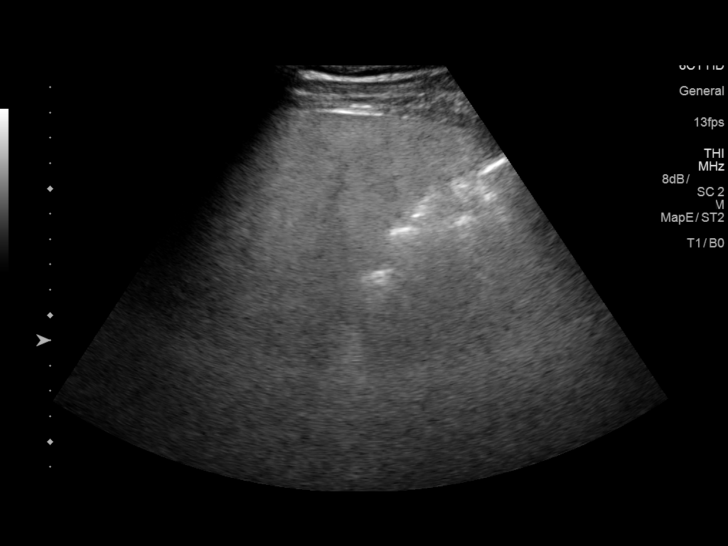
[im 83/91]
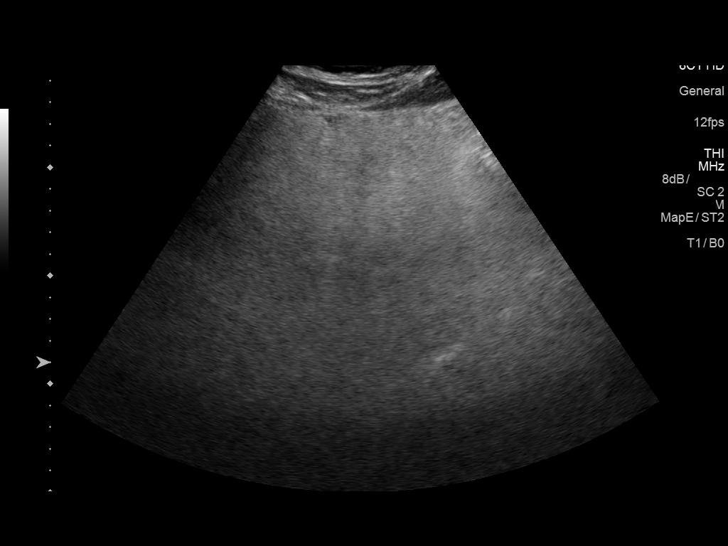
[im 91/91]
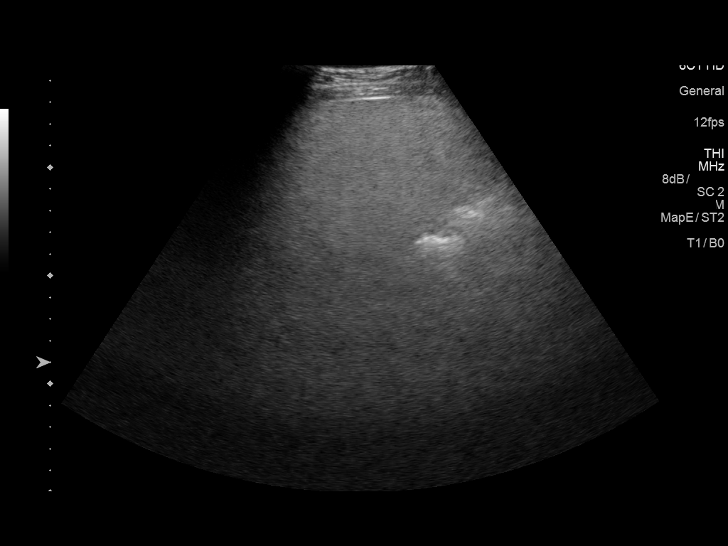

[14 of 25 positions shown; findings below may reference images not displayed]

FINDINGS: Gallbladder:

No gallstones or wall thickening visualized. There is no
pericholecystic fluid. No sonographic Murphy sign noted by
sonographer.

Common bile duct:

Diameter: 4 mm. No intrahepatic or extrahepatic biliary duct
dilatation.

Liver:

No focal lesion identified. Liver echogenicity is increased
diffusely. Portal vein is patent on color Doppler imaging with
normal direction of blood flow towards the liver.
IMPRESSION: Diffuse increase in liver echogenicity, a finding indicative of
hepatic steatosis. While no focal liver lesions are evident on this
study, it must be cautioned that the sensitivity of ultrasound for
detection of focal liver lesions is diminished significantly in this
circumstance.

Study otherwise unremarkable.

## 2021-04-07 ENCOUNTER — Telehealth (INDEPENDENT_AMBULATORY_CARE_PROVIDER_SITE_OTHER): Payer: 59 | Admitting: Family Medicine

## 2021-04-07 ENCOUNTER — Encounter: Payer: Self-pay | Admitting: Family Medicine

## 2021-04-07 DIAGNOSIS — A084 Viral intestinal infection, unspecified: Secondary | ICD-10-CM

## 2021-04-07 DIAGNOSIS — R739 Hyperglycemia, unspecified: Secondary | ICD-10-CM | POA: Diagnosis not present

## 2021-04-07 DIAGNOSIS — R531 Weakness: Secondary | ICD-10-CM | POA: Diagnosis not present

## 2021-04-07 MED ORDER — ONDANSETRON HCL 8 MG PO TABS: 8.0000 mg | ORAL_TABLET | Freq: Four times a day (QID) | ORAL | 0 refills | Status: AC | PRN

## 2021-04-07 MED ORDER — DIPHENOXYLATE-ATROPINE 2.5-0.025 MG PO TABS: 2.0000 | ORAL_TABLET | Freq: Four times a day (QID) | ORAL | 0 refills | Status: DC | PRN

## 2021-04-07 NOTE — Progress Notes (Signed)
   Subjective:    Patient ID: Tyler Best, male    DOB: 16-Nov-1985, 35 y.o.   MRN: [id number]  HPI Virtual Visit via Telephone Note  I connected with the patient on 04/07/21 at  4:00 PM EDT by telephone and verified that I am speaking with the correct person using two identifiers.   I discussed the limitations, risks, security and privacy concerns of performing an evaluation and management service by telephone and the availability of in person appointments. I also discussed with the patient that there may be a patient responsible charge related to this service. The patient expressed understanding and agreed to proceed.  Location patient: home Location provider: work or home office Participants present for the call: patient, provider Patient did not have a visit in the prior 7 days to address this/these issue(s).   History of Present Illness: Here for a number of concerns. First he has had several months of frequent mild headaches and generalized fatigue. He has a lot of heartburn and he takes Ranitidine daily for this. He says he is under a lot of stress and he thinks anxiety may be a large part of why he feels bad. He had a work physical with some screening labs about 6 weeks ago, and he was told he was healthy except for some elevated cholesterol numbers. Then 5 days ago he developed a more pronounced generalized fatigue with mild abdominal cramps, nausea with some vomiting, and diarrhea. No fever or body aches or ST or cough or SOB. He has tested negative for the Covid virus twice in the past 3 days. He took some Pepto-Bismol yesterday and today his stool is black in color.    Observations/Objective: Patient sounds cheerful and well on the phone. I do not appreciate any SOB. Speech and thought processing are grossly intact. Patient reported vitals:  Assessment and Plan: He has an acute viral enteritis, and he can take Lomotil as needed for diarrhea and Zofran as needed for  nausea. He will drink fluids. I think the black stool is the effect of the Pepto-Bismol ,but GI bleeding can not be ruled out. He will come by the lab tomorrow morning to check a CBC, etc. The etiology of his fatigue and headaches is not clear, but certainly anxiety could be playing a role. He will follow up with Dr. Salomon Fick about this. Gershon Crane, MD   Follow Up Instructions:     [phone] 5-10 [phone] 11-20 [id number] 21-30 I did not refer this patient for an OV in the next 24 hours for this/these issue(s).  I discussed the assessment and treatment plan with the patient. The patient was provided an opportunity to ask questions and all were answered. The patient agreed with the plan and demonstrated an understanding of the instructions.   The patient was advised to call back or seek an in-person evaluation if the symptoms worsen or if the condition fails to improve as anticipated.  I provided 23 minutes of non-face-to-face time during this encounter.   Gershon Crane, MD    Review of Systems     Objective:   Physical Exam        Assessment & Plan:

## 2021-04-08 ENCOUNTER — Encounter: Payer: Self-pay | Admitting: Family Medicine

## 2021-04-08 ENCOUNTER — Encounter: Payer: 59 | Admitting: Family Medicine

## 2021-04-08 ENCOUNTER — Other Ambulatory Visit (INDEPENDENT_AMBULATORY_CARE_PROVIDER_SITE_OTHER): Payer: 59

## 2021-04-08 ENCOUNTER — Other Ambulatory Visit: Payer: Self-pay

## 2021-04-08 DIAGNOSIS — R739 Hyperglycemia, unspecified: Secondary | ICD-10-CM | POA: Diagnosis not present

## 2021-04-08 DIAGNOSIS — R531 Weakness: Secondary | ICD-10-CM | POA: Diagnosis not present

## 2021-04-08 LAB — T4, FREE: Free T4: 0.81 ng/dL (ref 0.60–1.60)

## 2021-04-08 LAB — HEMOGLOBIN A1C: Hgb A1c MFr Bld: 4.9 % (ref 4.6–6.5)

## 2021-04-08 LAB — BASIC METABOLIC PANEL
BUN: 7 mg/dL (ref 6–23)
CO2: 28 mEq/L (ref 19–32)
Calcium: 9.4 mg/dL (ref 8.4–10.5)
Chloride: 97 mEq/L (ref 96–112)
Creatinine, Ser: 1.29 mg/dL (ref 0.40–1.50)
GFR: 70.92 mL/min (ref >60.00)
Glucose, Bld: 86 mg/dL (ref 70–99)
Potassium: 3.7 mEq/L (ref 3.5–5.1)
Sodium: 136 mEq/L (ref 135–145)

## 2021-04-08 LAB — HEPATIC FUNCTION PANEL
ALT: 32 U/L (ref 0–53)
AST: 33 U/L (ref 0–37)
Albumin: 4.7 g/dL (ref 3.5–5.2)
Alkaline Phosphatase: 82 U/L (ref 39–117)
Bilirubin, Direct: 0.1 mg/dL (ref 0.0–0.3)
Total Bilirubin: 0.8 mg/dL (ref 0.2–1.2)
Total Protein: 7.1 g/dL (ref 6.0–8.3)

## 2021-04-08 LAB — CBC WITH DIFFERENTIAL/PLATELET
Basophils Absolute: 0 10*3/uL (ref 0.0–0.1)
Basophils Relative: 0.7 % (ref 0.0–3.0)
Eosinophils Absolute: 0.1 10*3/uL (ref 0.0–0.7)
Eosinophils Relative: 1.3 % (ref 0.0–5.0)
HCT: 44.8 % (ref 39.0–52.0)
Hemoglobin: 15.8 g/dL (ref 13.0–17.0)
Lymphocytes Relative: 21.3 % (ref 12.0–46.0)
Lymphs Abs: 1.1 10*3/uL (ref 0.7–4.0)
MCHC: 35.3 g/dL (ref 30.0–36.0)
MCV: 91.5 fl (ref 78.0–100.0)
Monocytes Absolute: 0.5 10*3/uL (ref 0.1–1.0)
Monocytes Relative: 9.9 % (ref 3.0–12.0)
Neutro Abs: 3.5 10*3/uL (ref 1.4–7.7)
Neutrophils Relative %: 66.8 % (ref 43.0–77.0)
Platelets: 186 10*3/uL (ref 150.0–400.0)
RBC: 4.89 Mil/uL (ref 4.22–5.81)
RDW: 13 % (ref 11.5–15.5)
WBC: 5.2 10*3/uL (ref 4.0–10.5)

## 2021-04-08 LAB — TSH: TSH: 2.37 u[IU]/mL (ref 0.35–4.50)

## 2021-04-08 LAB — T3, FREE: T3, Free: 2.9 pg/mL (ref 2.3–4.2)

## 2021-04-13 ENCOUNTER — Other Ambulatory Visit: Payer: Self-pay

## 2021-04-14 ENCOUNTER — Ambulatory Visit (INDEPENDENT_AMBULATORY_CARE_PROVIDER_SITE_OTHER): Payer: 59 | Admitting: Family Medicine

## 2021-04-14 VITALS — BP 132/83 | Temp 97.9°F | Ht 70.0 in | Wt 195.2 lb

## 2021-04-14 DIAGNOSIS — M109 Gout, unspecified: Secondary | ICD-10-CM

## 2021-04-14 DIAGNOSIS — R001 Bradycardia, unspecified: Secondary | ICD-10-CM

## 2021-04-14 DIAGNOSIS — R42 Dizziness and giddiness: Secondary | ICD-10-CM | POA: Diagnosis not present

## 2021-04-14 DIAGNOSIS — E782 Mixed hyperlipidemia: Secondary | ICD-10-CM

## 2021-04-14 DIAGNOSIS — Z0000 Encounter for general adult medical examination without abnormal findings: Secondary | ICD-10-CM | POA: Diagnosis not present

## 2021-04-14 DIAGNOSIS — I4510 Unspecified right bundle-branch block: Secondary | ICD-10-CM

## 2021-04-14 MED ORDER — PREDNISONE 10 MG PO TABS: ORAL_TABLET | ORAL | 0 refills | Status: DC

## 2021-04-14 NOTE — Patient Instructions (Addendum)
Dizziness Dizziness is a common problem. It is a feeling of unsteadiness or light-headedness. You may feel like you are about to faint. Dizziness can lead to injury if you stumble or fall. Anyone can become dizzy, but dizziness is more common in older adults. This condition can be caused by a number of things, including medicines, dehydration, or illness. Follow these instructions at home: Eating and drinking  Drink enough fluid to keep your urine clear or pale yellow. This helps to keep you from becoming dehydrated. Try to drink more clear fluids, such as water.  Do not drink alcohol.  Limit your caffeine intake if told to do so by your health care provider. Check ingredients and nutrition facts to see if a food or beverage contains caffeine.  Limit your salt (sodium) intake if told to do so by your health care provider. Check ingredients and nutrition facts to see if a food or beverage contains sodium. Activity  Avoid making quick movements. ? Rise slowly from chairs and steady yourself until you feel okay. ? In the morning, first sit up on the side of the bed. When you feel okay, stand slowly while you hold onto something until you know that your balance is fine.  If you need to stand in one place for a long time, move your legs often. Tighten and relax the muscles in your legs while you are standing.  Do not drive or use heavy machinery if you feel dizzy.  Avoid bending down if you feel dizzy. Place items in your home so that they are easy for you to reach without leaning over. Lifestyle  Do not use any products that contain nicotine or tobacco, such as cigarettes and e-cigarettes. If you need help quitting, ask your health care provider.  Try to reduce your stress level by using methods such as yoga or meditation. Talk with your health care provider if you need help to manage your stress. General instructions  Watch your dizziness for any changes.  Take over-the-counter and  prescription medicines only as told by your health care provider. Talk with your health care provider if you think that your dizziness is caused by a medicine that you are taking.  Tell a friend or a family member that you are feeling dizzy. If he or she notices any changes in your behavior, have this person call your health care provider.  Keep all follow-up visits as told by your health care provider. This is important. Contact a health care provider if:  Your dizziness does not go away.  Your dizziness or light-headedness gets worse.  You feel nauseous.  You have reduced hearing.  You have new symptoms.  You are unsteady on your feet or you feel like the room is spinning. Get help right away if:  You vomit or have diarrhea and are unable to eat or drink anything.  You have problems talking, walking, swallowing, or using your arms, hands, or legs.  You feel generally weak.  You are not thinking clearly or you have trouble forming sentences. It may take a friend or family member to notice this.  You have chest pain, abdominal pain, shortness of breath, or sweating.  Your vision changes.  You have any bleeding.  You have a severe headache.  You have neck pain or a stiff neck.  You have a fever. These symptoms may represent a serious problem that is an emergency. Do not wait to see if the symptoms will go away. Get medical help   right away. Call your local emergency services (911 in the [place] Do not drive yourself to the hospital. Summary  Dizziness is a feeling of unsteadiness or light-headedness. This condition can be caused by a number of things, including medicines, dehydration, or illness.  Anyone can become dizzy, but dizziness is more common in older adults.  Drink enough fluid to keep your urine clear or pale yellow. Do not drink alcohol.  Avoid making quick movements if you feel dizzy. Monitor your dizziness for any changes. This information is not intended to  replace advice given to you by your health care provider. Make sure you discuss any questions you have with your health care provider. Document Revised: 11/03/2017 Document Reviewed: 12/03/2016 Elsevier Patient Education  2021 [employer] Inc.  Gout  Gout is a condition that causes painful swelling of the joints. Gout is a type of inflammation of the joints (arthritis). This condition is caused by having too much uric acid in the body. Uric acid is a chemical that forms when the body breaks down substances called purines. Purines are important for building body proteins. When the body has too much uric acid, sharp crystals can form and build up inside the joints. This causes pain and swelling. Gout attacks can happen quickly and may be very painful (acute gout). Over time, the attacks can affect more joints and become more frequent (chronic gout). Gout can also cause uric acid to build up under the skin and inside the kidneys. What are the causes? This condition is caused by too much uric acid in your blood. This can happen because:  Your kidneys do not remove enough uric acid from your blood. This is the most common cause.  Your body makes too much uric acid. This can happen with some cancers and cancer treatments. It can also occur if your body is breaking down too many red blood cells (hemolytic anemia).  You eat too many foods that are high in purines. These foods include organ meats and some seafood. Alcohol, especially beer, is also high in purines. A gout attack may be triggered by trauma or stress. What increases the risk? You are more likely to develop this condition if you:  Have a family history of gout.  Are male and middle-aged.  Are male and have gone through menopause.  Are obese.  Frequently drink alcohol, especially beer.  Are dehydrated.  Lose weight too quickly.  Have an organ transplant.  Have lead poisoning.  Take certain medicines, including aspirin,  cyclosporine, diuretics, levodopa, and niacin.  Have kidney disease.  Have a skin condition called psoriasis. What are the signs or symptoms? An attack of acute gout happens quickly. It usually occurs in just one joint. The most common place is the big toe. Attacks often start at night. Other joints that may be affected include joints of the feet, ankle, knee, fingers, wrist, or elbow. Symptoms of this condition may include:  Severe pain.  Warmth.  Swelling.  Stiffness.  Tenderness. The affected joint may be very painful to touch.  Shiny, red, or purple skin.  Chills and fever. Chronic gout may cause symptoms more frequently. More joints may be involved. You may also have white or yellow lumps (tophi) on your hands or feet or in other areas near your joints.   How is this diagnosed? This condition is diagnosed based on your symptoms, medical history, and physical exam. You may have tests, such as:  Blood tests to measure uric acid levels.  Removal  of joint fluid with a thin needle (aspiration) to look for uric acid crystals.  X-rays to look for joint damage. How is this treated? Treatment for this condition has two phases: treating an acute attack and preventing future attacks. Acute gout treatment may include medicines to reduce pain and swelling, including:  NSAIDs.  Steroids. These are strong anti-inflammatory medicines that can be taken by mouth (orally) or injected into a joint.  Colchicine. This medicine relieves pain and swelling when it is taken soon after an attack. It can be given by mouth or through an IV. Preventive treatment may include:  Daily use of smaller doses of NSAIDs or colchicine.  Use of a medicine that reduces uric acid levels in your blood.  Changes to your diet. You may need to see a dietitian about what to eat and drink to prevent gout. Follow these instructions at home: During a gout attack  If directed, put ice on the affected area: ? Put  ice in a plastic bag. ? Place a towel between your skin and the bag. ? Leave the ice on for 20 minutes, 2-3 times a day.  Raise (elevate) the affected joint above the level of your heart as often as possible.  Rest the joint as much as possible. If the affected joint is in your leg, you may be given crutches to use.  Follow instructions from your health care provider about eating or drinking restrictions.   Avoiding future gout attacks  Follow a low-purine diet as told by your dietitian or health care provider. Avoid foods and drinks that are high in purines, including liver, kidney, anchovies, asparagus, herring, mushrooms, mussels, and beer.  Maintain a healthy weight or lose weight if you are overweight. If you want to lose weight, talk with your health care provider. It is important that you do not lose weight too quickly.  Start or maintain an exercise program as told by your health care provider. Eating and drinking  Drink enough fluids to keep your urine pale yellow.  If you drink alcohol: ? Limit how much you use to:  0-1 drink a day for women.  0-2 drinks a day for men. ? Be aware of how much alcohol is in your drink. In the [place] one drink equals one 12 oz bottle of beer (355 mL) one 5 oz glass of wine (148 mL), or one 1 oz glass of hard liquor (44 mL). General instructions  Take over-the-counter and prescription medicines only as told by your health care provider.  Do not drive or use heavy machinery while taking prescription pain medicine.  Return to your normal activities as told by your health care provider. Ask your health care provider what activities are safe for you.  Keep all follow-up visits as told by your health care provider. This is important. Contact a health care provider if you have:  Another gout attack.  Continuing symptoms of a gout attack after 10 days of treatment.  Side effects from your medicines.  Chills or a fever.  Burning pain when  you urinate.  Pain in your lower back or belly. Get help right away if you:  Have severe or uncontrolled pain.  Cannot urinate. Summary  Gout is painful swelling of the joints caused by inflammation.  The most common site of pain is the big toe, but it can affect other joints in the body.  Medicines and dietary changes can help to prevent and treat gout attacks. This information is not intended  to replace advice given to you by your health care provider. Make sure you discuss any questions you have with your health care provider. Document Revised: 05/23/2018 Document Reviewed: 05/23/2018 Elsevier Patient Education  2021 Elsevier Inc.  Low-Purine Eating Plan A low-purine eating plan involves making food choices to limit your intake of purine. Purine is a kind of uric acid. Too much uric acid in your blood can cause certain conditions, such as gout and kidney stones. Eating a low-purine diet can help control these conditions. What are tips for following this plan? Reading food labels  Avoid foods with saturated or Trans fat.  Check the ingredient list of grains-based foods, such as bread and cereal, to make sure that they contain whole grains.  Check the ingredient list of sauces or soups to make sure they do not contain meat or fish.  When choosing soft drinks, check the ingredient list to make sure they do not contain high-fructose corn syrup. Shopping  Buy plenty of fresh fruits and vegetables.  Avoid buying canned or fresh fish.  Buy dairy products labeled as low-fat or nonfat.  Avoid buying premade or processed foods. These foods are often high in fat, salt (sodium), and added sugar.   Cooking  Use olive oil instead of butter when cooking. Oils like olive oil, canola oil, and sunflower oil contain healthy fats. Meal planning  Learn which foods do or do not affect you. If you find out that a food tends to cause your gout symptoms to flare up, avoid eating that food.  You can enjoy foods that do not cause problems. If you have any questions about a food item, talk with your dietitian or health care provider.  Limit foods high in fat, especially saturated fat. Fat makes it harder for your body to get rid of uric acid.  Choose foods that are lower in fat and are lean sources of protein. General guidelines  Limit alcohol intake to no more than 1 drink a day for nonpregnant women and 2 drinks a day for men. One drink equals 12 oz of beer, 5 oz of wine, or 1 oz of hard liquor. Alcohol can affect the way your body gets rid of uric acid.  Drink plenty of water to keep your urine clear or pale yellow. Fluids can help remove uric acid from your body.  If directed by your health care provider, take a vitamin C supplement.  Work with your health care provider and dietitian to develop a plan to achieve or maintain a healthy weight. Losing weight can help reduce uric acid in your blood. What foods are recommended? The items listed may not be a complete list. Talk with your dietitian about what dietary choices are best for you. Foods low in purines Foods low in purines do not need to be limited. These include:  All fruits.  All low-purine vegetables, pickles, and olives.  Breads, pasta, rice, cornbread, and popcorn. Cake and other baked goods.  All dairy foods.  Eggs, nuts, and nut butters.  Spices and condiments, such as salt, herbs, and vinegar.  Plant oils, butter, and margarine.  Water, sugar-free soft drinks, tea, coffee, and cocoa.  Vegetable-based soups, broths, sauces, and gravies. Foods moderate in purines Foods moderate in purines should be limited to the amounts listed.   cup of asparagus, cauliflower, spinach, mushrooms, or green peas, each day.  2/3 cup uncooked oatmeal, each day.   cup dry wheat bran or wheat germ, each day.  2-3 ounces  of meat or poultry, each day.  4-6 ounces of shellfish, such as crab, lobster, oysters, or  shrimp, each day.  1 cup cooked beans, peas, or lentils, each day.  Soup, broths, or bouillon made from meat or fish. Limit these foods as much as possible. What foods are not recommended? The items listed may not be a complete list. Talk with your dietitian about what dietary choices are best for you. Limit your intake of foods high in purines, including:  Beer and other alcohol.  Meat-based gravy or sauce.  Canned or fresh fish, such as: ? Anchovies, sardines, herring, and tuna. ? Mussels and scallops. ? Codfish, trout, and haddock.  Tomasa Blase.  Organ meats, such as: ? Liver or kidney. ? Tripe. ? Sweetbreads (thymus gland or pancreas).  Wild Education officer, environmental.  Yeast or yeast extract supplements.  Drinks sweetened with high-fructose corn syrup. Summary  Eating a low-purine diet can help control conditions caused by too much uric acid in the body, such as gout or kidney stones.  Choose low-purine foods, limit alcohol, and limit foods high in fat.  You will learn over time which foods do or do not affect you. If you find out that a food tends to cause your gout symptoms to flare up, avoid eating that food. This information is not intended to replace advice given to you by your health care provider. Make sure you discuss any questions you have with your health care provider. Document Revised: 02/13/2020 Document Reviewed: 02/13/2020 Elsevier Patient Education  2021 Elsevier Inc.  High Cholesterol  High cholesterol is a condition in which the blood has high levels of a white, waxy substance similar to fat (cholesterol). The liver makes all the cholesterol that the body needs. The human body needs small amounts of cholesterol to help build cells. A person gets extra or excess cholesterol from the food that he or she eats. The blood carries cholesterol from the liver to the rest of the body. If you have high cholesterol, deposits (plaques) may build up on the walls of your arteries.  Arteries are the blood vessels that carry blood away from your heart. These plaques make the arteries narrow and stiff. Cholesterol plaques increase your risk for heart attack and stroke. Work with your health care provider to keep your cholesterol levels in a healthy range. What increases the risk? The following factors may make you more likely to develop this condition:  Eating foods that are high in animal fat (saturated fat) or cholesterol.  Being overweight.  Not getting enough exercise.  A family history of high cholesterol (familial hypercholesterolemia).  Use of tobacco products.  Having diabetes. What are the signs or symptoms? There are no symptoms of this condition. How is this diagnosed? This condition may be diagnosed based on the results of a blood test.  If you are older than 37 years of age, your health care provider may check your cholesterol levels every 4-6 years.  You may be checked more often if you have high cholesterol or other risk factors for heart disease. The blood test for cholesterol measures:  "Bad" cholesterol, or LDL cholesterol. This is the main type of cholesterol that causes heart disease. The desired level is less than 100 mg/dL.  "Good" cholesterol, or HDL cholesterol. HDL helps protect against heart disease by cleaning the arteries and carrying the LDL to the liver for processing. The desired level for HDL is 60 mg/dL or higher.  Triglycerides. These are fats that your  body can store or burn for energy. The desired level is less than 150 mg/dL.  Total cholesterol. This measures the total amount of cholesterol in your blood and includes LDL, HDL, and triglycerides. The desired level is less than 200 mg/dL. How is this treated? This condition may be treated with:  Diet changes. You may be asked to eat foods that have more fiber and less saturated fats or added sugar.  Lifestyle changes. These may include regular exercise, maintaining a healthy  weight, and quitting use of tobacco products.  Medicines. These are given when diet and lifestyle changes have not worked. You may be prescribed a statin medicine to help lower your cholesterol levels. Follow these instructions at home: Eating and drinking  Eat a healthy, balanced diet. This diet includes: ? Daily servings of a variety of fresh, frozen, or canned fruits and vegetables. ? Daily servings of whole grain foods that are rich in fiber. ? Foods that are low in saturated fats and trans fats. These include poultry and fish without skin, lean cuts of meat, and low-fat dairy products. ? A variety of fish, especially oily fish that contain omega-3 fatty acids. Aim to eat fish at least 2 times a week.  Avoid foods and drinks that have added sugar.  Use healthy cooking methods, such as roasting, grilling, broiling, baking, poaching, steaming, and stir-frying. Do not fry your food except for stir-frying.   Lifestyle  Get regular exercise. Aim to exercise for a total of 150 minutes a week. Increase your activity level by doing activities such as gardening, walking, and taking the stairs.  Do not use any products that contain nicotine or tobacco, such as cigarettes, e-cigarettes, and chewing tobacco. If you need help quitting, ask your health care provider.   General instructions  Take over-the-counter and prescription medicines only as told by your health care provider.  Keep all follow-up visits as told by your health care provider. This is important. Where to find more information  American Heart Association: www.heart.org  National Heart, Lung, and Blood Institute: [employer]is Contact a health care provider if:  You have trouble achieving or maintaining a healthy diet or weight.  You are starting an exercise program.  You are unable to stop smoking. Get help right away if:  You have chest pain.  You have trouble breathing.  You have any symptoms of a stroke. "BE  FAST" is an easy way to remember the main warning signs of a stroke: ? B - Balance. Signs are dizziness, sudden trouble walking, or loss of balance. ? E - Eyes. Signs are trouble seeing or a sudden change in vision. ? F - Face. Signs are sudden weakness or numbness of the face, or the face or eyelid drooping on one side. ? A - Arms. Signs are weakness or numbness in an arm. This happens suddenly and usually on one side of the body. ? S - Speech. Signs are sudden trouble speaking, slurred speech, or trouble understanding what people say. ? T - Time. Time to call emergency services. Write down what time symptoms started.  You have other signs of a stroke, such as: ? A sudden, severe headache with no known cause. ? Nausea or vomiting. ? Seizure. These symptoms may represent a serious problem that is an emergency. Do not wait to see if the symptoms will go away. Get medical help right away. Call your local emergency services (911 in the [place] Do not drive yourself to the hospital. Summary  Cholesterol plaques increase your risk for heart attack and stroke. Work with your health care provider to keep your cholesterol levels in a healthy range.  Eat a healthy, balanced diet, get regular exercise, and maintain a healthy weight.  Do not use any products that contain nicotine or tobacco, such as cigarettes, e-cigarettes, and chewing tobacco.  Get help right away if you have any symptoms of a stroke. This information is not intended to replace advice given to you by your health care provider. Make sure you discuss any questions you have with your health care provider. Document Revised: 09/30/2019 Document Reviewed: 09/30/2019 Elsevier Patient Education  2021 [employer]  Preventive Care 4821-37 Years Old, Male Preventive care refers to lifestyle choices and visits with your health care provider that can promote health and wellness. This includes:  A yearly physical exam. This is also called an  annual wellness visit.  Regular dental and eye exams.  Immunizations.  Screening for certain conditions.  Healthy lifestyle choices, such as: ? Eating a healthy diet. ? Getting regular exercise. ? Not using drugs or products that contain nicotine and tobacco. ? Limiting alcohol use. What can I expect for my preventive care visit? Physical exam Your health care provider may check your:  Height and weight. These may be used to calculate your BMI (body mass index). BMI is a measurement that tells if you are at a healthy weight.  Heart rate and blood pressure.  Body temperature.  Skin for abnormal spots. Counseling Your health care provider may ask you questions about your:  Past medical problems.  Family's medical history.  Alcohol, tobacco, and drug use.  Emotional well-being.  Home life and relationship well-being.  Sexual activity.  Diet, exercise, and sleep habits.  Work and work [employer].  Access to firearms. What immunizations do I need? Vaccines are usually given at various ages, according to a schedule. Your health care provider will recommend vaccines for you based on your age, medical history, and lifestyle or other factors, such as travel or where you work.   What tests do I need? Blood tests  Lipid and cholesterol levels. These may be checked every 5 years starting at age 37.  Hepatitis C test.  Hepatitis B test. Screening  Diabetes screening. This is done by checking your blood sugar (glucose) after you have not eaten for a while (fasting).  Genital exam to check for testicular cancer or hernias.  STD (sexually transmitted disease) testing, if you are at risk. Talk with your health care provider about your test results, treatment options, and if necessary, the need for more tests.   Follow these instructions at home: Eating and drinking  Eat a healthy diet that includes fresh fruits and vegetables, whole grains, lean protein, and low-fat  dairy products.  Drink enough fluid to keep your urine pale yellow.  Take vitamin and mineral supplements as recommended by your health care provider.  Do not drink alcohol if your health care provider tells you not to drink.  If you drink alcohol: ? Limit how much you have to 0-2 drinks a day. ? Be aware of how much alcohol is in your drink. In the [place] one drink equals one 12 oz bottle of beer (355 mL), one 5 oz glass of wine (148 mL), or one 1 oz glass of hard liquor (44 mL).   Lifestyle  Take daily care of your teeth and gums. Brush your teeth every morning and night with fluoride toothpaste. Floss one time  each day.  Stay active. Exercise for at least 30 minutes 5 or more days each week.  Do not use any products that contain nicotine or tobacco, such as cigarettes, e-cigarettes, and chewing tobacco. If you need help quitting, ask your health care provider.  Do not use drugs.  If you are sexually active, practice safe sex. Use a condom or other form of protection to prevent STIs (sexually transmitted infections).  Find healthy ways to cope with stress, such as: ? Meditation, yoga, or listening to music. ? Journaling. ? Talking to a trusted person. ? Spending time with friends and family. Safety  Always wear your seat belt while driving or riding in a vehicle.  Do not drive: ? If you have been drinking alcohol. Do not ride with someone who has been drinking. ? When you are tired or distracted. ? While texting.  Wear a helmet and other protective equipment during sports activities.  If you have firearms in your house, make sure you follow all gun safety procedures.  Seek help if you have been physically or sexually abused. What's next?  Go to your health care provider once a year for an annual wellness visit.  Ask your health care provider how often you should have your eyes and teeth checked.  Stay up to date on all vaccines. This information is not intended to  replace advice given to you by your health care provider. Make sure you discuss any questions you have with your health care provider. Document Revised: 07/17/2019 Document Reviewed: 10/25/2018 Elsevier Patient Education  2021 Elsevier Inc.  Right Bundle Branch Block  Right bundle branch block (RBBB) is a problem with the way that electrical impulses pass through the heart (electrical conduction abnormality). The heart depends on an electrical pulse to beat normally. The electrical signal for a heartbeat starts in the upper chambers of the heart (atria) and then travels to the two lower chambers (left and rightventricles). An RBBB is a partial or complete block of the pathway that carries the signal to the right ventricle. If you have RBBB, the right side of your heart beats a little more slowly than the left side. RBBB may be a warning of heart disease or a lung problem. What are the causes? This condition may be caused by:  Heart attack (myocardial infarction).  Being born with a heart defect (congenital heart disease).  A blood clot that flows into the lung (pulmonary embolism).  Infection of heart muscle (myocarditis).  High blood pressure. In some cases, the cause may not be known. What increases the risk? The following factors may make you more likely to develop this condition:  Being male.  Being 80 years of age or older.  Having heart disease.  Having had a heart attack or heart surgery.  Having an enlarged heart.  Having a hole in the walls between the chambers of the heart (septal defect). What are the signs or symptoms? This condition does not typically cause symptoms. How is this diagnosed? This condition may be diagnosed based on an electrocardiogram (ECG). It is often diagnosed when an ECG is done as part of a routine physical. You may also have imaging tests to find out more about your condition. These may include:  Chest X-rays.  Echocardiogram. How is  this treated? Treatment may not be needed for this condition if you do not have symptoms or any other heart problems. However, you may need to see your health care provider more often because RBBB  can be a warning sign of future heart or lung problems. You may need treatment for another condition that may be causing RBBB. Follow these instructions at home: Lifestyle  Follow instructions from your health care provider about eating or drinking restrictions.  Follow a heart-healthy diet and maintain a healthy weight. Work with a dietitian to create an eating plan that is best for you.  Do not use any products that contain nicotine or tobacco, such as cigarettes, e-cigarettes, and chewing tobacco. If you need help quitting, ask your health care provider.   Activity  Get regular exercise as told by your health care provider.  Return to your normal activities as told by your health care provider. Ask your health care provider what activities are safe for you. General instructions  Take over-the-counter and prescription medicines only as told by your health care provider.  Keep all follow-up visits as told by your health care provider. This is important. Contact a health care provider if:  You are light-headed.  You faint. Get help right away if:  You have chest pain.  You have trouble breathing. These symptoms may represent a serious problem that is an emergency. Do not wait to see if the symptoms will go away. Get medical help right away. Call your local emergency services (911 in the [place] Do not drive yourself to the hospital.  Summary  For the heart to beat normally, an electrical signal must travel to the heart's lower right chamber. Right bundle branch block (RBBB) is a partial or complete block of the pathway that carries that signal.  This condition does not typically cause symptoms.  Treatment may not be needed for RBBB if you do not have symptoms or any other heart  problems.  You may need to see your health care provider more often because RBBB can be a warning sign of future heart or lung problems. This information is not intended to replace advice given to you by your health care provider. Make sure you discuss any questions you have with your health care provider. Document Revised: 04/30/2019 Document Reviewed: 04/30/2019 Elsevier Patient Education  2021 [employer].

## 2021-04-14 NOTE — Progress Notes (Signed)
Subjective:     Menno Vanbergen is a 35 y.o. male and is here for a comprehensive physical exam. The patient reports elevated cholesterol and triglycerides at work.  Pt with episodes of dizzy spells/feeling lightheaded.  Pt notes blurred vision and pressure behind eyes with headaches.  Having gout flare.  Started on right foot 1 week ago now on left foot.  Patient requesting regular prescription for prednisone as that is the only thing that seems to help.  Patient has prescription for colchicine and allopurinol however taking both as needed.  Social History   Socioeconomic History   Marital status: Married    Spouse name: Not on file   Number of children: Not on file   Years of education: Not on file   Highest education level: Not on file  Occupational History   Not on file  Tobacco Use   Smoking status: Never Smoker   Smokeless tobacco: Current User    Types: Snuff  Vaping Use   Vaping Use: Never used  Substance and Sexual Activity   Alcohol use: Yes    Alcohol/week: 6.0 standard drinks    Types: 6 Cans of beer per week   Drug use: No   Sexual activity: Yes    Birth control/protection: None  Other Topics Concern   Not on file  Social History Narrative   Not on file   Social Determinants of Health   Financial Resource Strain: Not on file  Food Insecurity: Not on file  Transportation Needs: Not on file  Physical Activity: Not on file  Stress: Not on file  Social Connections: Not on file  Intimate Partner Violence: Not on file   Health Maintenance  Topic Date Due   COVID-19 Vaccine (1) Never done   HIV Screening  Never done   Hepatitis C Screening  Never done   TETANUS/TDAP  Never done   INFLUENZA VACCINE  06/14/2021   Zoster Vaccines- Shingrix (1 of 2) 12/06/2033   HPV VACCINES  Aged Out    The following portions of the patient's history were reviewed and updated as appropriate: allergies, current medications, past family history, past medical history, past  social history, past surgical history, and problem list.   Review of Systems Pertinent items noted in HPI and remainder of comprehensive ROS otherwise negative.   Objective:    BP 132/83   Temp 97.9 F (36.6 C)   Ht 5\' 10"  (1.778 m)   Wt 195 lb 3.2 oz (88.5 kg)   BMI 28.01 kg/m  General appearance: alert, cooperative, and no distress Head: Normocephalic, without obvious abnormality, atraumatic Eyes: conjunctivae/corneas clear. PERRL, EOM's intact. Fundi benign. Ears: normal TM's and external ear canals both ears Nose: Nares normal. Septum midline. Mucosa normal. No drainage or sinus tenderness. Throat: lips, mucosa, and tongue normal; teeth and gums normal Neck: no adenopathy, no carotid bruit, no JVD, supple, symmetrical, trachea midline, and thyroid not enlarged, symmetric, no tenderness/mass/nodules Lungs: clear to auscultation bilaterally Heart: regular rate and rhythm, S1, S2 normal, no murmur, click, rub or gallop Abdomen: soft, non-tender; bowel sounds normal; no masses,  no organomegaly Extremities: extremities normal, atraumatic, no cyanosis or edema Pulses: 2+ and symmetric Skin: Skin color, texture, turgor normal. No rashes or lesions Lymph nodes: Cervical, supraclavicular, and axillary nodes normal. Neurologic: Alert and oriented X 3, normal strength and tone. Normal symmetric reflexes. Normal coordination and gait    Assessment:     Pt is a 35 yo male exam with elevated lipid  panel at recent work physical, h/o episodic dizziness and gout spells.      Plan:    Anticipatory guidance given including wearing seatbelts, smoke detectors in the home, increasing physical activity, increasing p.o. intake of water and vegetables. -will obtain labs -given handout  -next CPE in 1 yr See After Visit Summary for Counseling Recommendations    Mixed hyperlipidemia -lifestyle modifications strongly encouraged -Total cholesterol 264, Triglycerides 255, LDL 160, HDL  62 -consider fish oil tablets  Dizziness  -discussed possible causes including dehydration, HTN, arrhythmia, CVD, hyper or hypoglycemia.  Given HAs and pain behind eye also consider migraines or intercranial process. -EKG with sinus bradycardia , incomplete RBBB.  No prior studies for comparison. -CXR, Holter monitor, and ECHO for continued or worsened symptoms. - Plan: EKG 12-Lead  Acute gout of left foot, unspecified cause -discussed the proper use of colchicine and allopurinol. -diet modifications -will start prednisone taper -given handouts  F/u in 2-4 wks, sooner if needed  Abbe Amsterdam, MD

## 2021-04-24 ENCOUNTER — Encounter: Payer: Self-pay | Admitting: Family Medicine

## 2021-05-03 ENCOUNTER — Telehealth (INDEPENDENT_AMBULATORY_CARE_PROVIDER_SITE_OTHER): Payer: 59 | Admitting: Family Medicine

## 2021-05-03 ENCOUNTER — Encounter: Payer: Self-pay | Admitting: Family Medicine

## 2021-05-03 DIAGNOSIS — F419 Anxiety disorder, unspecified: Secondary | ICD-10-CM | POA: Insufficient documentation

## 2021-05-03 DIAGNOSIS — F321 Major depressive disorder, single episode, moderate: Secondary | ICD-10-CM

## 2021-05-03 MED ORDER — SERTRALINE HCL 25 MG PO TABS: 25.0000 mg | ORAL_TABLET | Freq: Every day | ORAL | 2 refills | Status: DC

## 2021-05-03 NOTE — Progress Notes (Signed)
Virtual Visit via Video Note  I connected with Tyler Best on 05/03/21 at  2:30 PM EDT by a video enabled telemedicine application 2/2 COVID-19 pandemic and verified that I am speaking with the correct person using two identifiers.  Location patient: home Location provider:work or home office Persons participating in the virtual visit: patient, provider  I discussed the limitations of evaluation and management by telemedicine and the availability of in person appointments. The patient expressed understanding and agreed to proceed.   HPI: Pt with increased HAs,nausea, lightheadedness.  Having episodes of anxiety recently while at the beach.  Had to stop a few times on the drive back from the trip to throw up.  States was worried about his kids more during the trip to the point other people noticed.  While at the beach pt had a cyst on his tailbone. Pt given doxycycline.  States had n/v while on the med.  Mind races at night making it difficult to sleep.  May get up in the middle of the night.  Wakes up feeling tired.  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  Pt thinks he has been dealing with anxiety for the last 10 yrs, but has been able to control it without others noticing until recently.  Has 2 kids under 3 yo at home.    Pt uses snuff.   ROS: See pertinent positives and negatives per HPI.  No past medical history on file.  Past Surgical History:  Procedure Laterality Date   APPENDECTOMY      Family History  Problem Relation Age of Onset   Diabetes Father    Cancer Maternal Grandfather      Current Outpatient Medications:    allopurinol (ZYLOPRIM) 100 MG tablet, Take 100 mg by mouth daily., Disp: , Rfl:    allopurinol (ZYLOPRIM) 100 MG tablet, TAKE 1 TABLET BY MOUTH EVERY DAY, Disp: 30 tablet, Rfl: 2   allopurinol (ZYLOPRIM) 100 MG tablet, Take 1 tablet (100 mg  total) by mouth daily., Disp: 90 tablet, Rfl: 1   ciprofloxacin-dexamethasone (CIPRODEX) OTIC suspension, Place 4 drops into the right ear 2 (two) times daily., Disp: 7.5 mL, Rfl: 0   colchicine 0.6 MG tablet, Take 1 tablet (0.6 mg total) by mouth 2 (two) times daily as needed., Disp: 60 tablet, Rfl: 1   predniSONE (DELTASONE) 10 MG tablet, Take 4 tabs every morning for 3 days, 3 tabs for 2 days, 2 tabs for 2 days, 1 tab for 1 day., Disp: 23 tablet, Rfl: 0   diphenoxylate-atropine (LOMOTIL) 2.5-0.025 MG tablet, Take 2 tablets by mouth 4 (four) times daily as needed for diarrhea or loose stools. (Patient not taking: No sig reported), Disp: 60 tablet, Rfl: 0   ketoconazole (NIZORAL) 200 MG tablet, Take 1 tablet (200 mg total) by mouth in the morning and at bedtime. (Patient not taking: No sig reported), Disp: 60 tablet, Rfl: 0   ondansetron (ZOFRAN) 8 MG tablet, Take 1 tablet (8 mg total) by mouth every 6 (six) hours as needed for nausea or vomiting. (Patient not taking: No sig reported), Disp: 60 tablet, Rfl: 0  EXAM:  VITALS per patient if applicable: RR between 12-20 BPM  GENERAL: alert, oriented, appears well and in no acute distress  HEENT: atraumatic, conjunctiva clear, no obvious abnormalities on inspection of external nose and ears  NECK: normal movements  of the head and neck  LUNGS: on inspection no signs of respiratory distress, breathing rate appears normal, no obvious gross SOB, gasping or wheezing  CV: no obvious cyanosis  MS: moves all visible extremities without noticeable abnormality  PSYCH/NEURO: pleasant and cooperative, no obvious depression or anxiety, speech and thought processing grossly intact  ASSESSMENT AND PLAN:  Discussed the following assessment and plan:  Anxiety  -GAD-7 score 13 -Discussed counseling and medication options -Patient wishes to proceed with medication at this time. -We will start Zoloft 25 mg daily.  Advised may take 4-6 weeks for  improvement in symptoms - Plan: sertraline (ZOLOFT) 25 MG tablet  Depression, major, single episode, moderate (HCC)  -PHQ-9 score 10 -discussed counseling and medication options -will start zoloft 25 mg daily -self care encouraged. - Plan: sertraline (ZOLOFT) 25 MG tablet  F/u in 4-6 wks   I discussed the assessment and treatment plan with the patient. The patient was provided an opportunity to ask questions and all were answered. The patient agreed with the plan and demonstrated an understanding of the instructions.   The patient was advised to call back or seek an in-person evaluation if the symptoms worsen or if the condition fails to improve as anticipated.   Deeann Saint, MD

## 2021-05-24 ENCOUNTER — Telehealth: Payer: Self-pay | Admitting: Family Medicine

## 2021-05-24 ENCOUNTER — Encounter: Payer: Self-pay | Admitting: Family Medicine

## 2021-05-24 ENCOUNTER — Telehealth (INDEPENDENT_AMBULATORY_CARE_PROVIDER_SITE_OTHER): Payer: 59 | Admitting: Family Medicine

## 2021-05-24 DIAGNOSIS — F17220 Nicotine dependence, chewing tobacco, uncomplicated: Secondary | ICD-10-CM

## 2021-05-24 DIAGNOSIS — R634 Abnormal weight loss: Secondary | ICD-10-CM

## 2021-05-24 DIAGNOSIS — F419 Anxiety disorder, unspecified: Secondary | ICD-10-CM

## 2021-05-24 DIAGNOSIS — R112 Nausea with vomiting, unspecified: Secondary | ICD-10-CM

## 2021-05-24 DIAGNOSIS — G4452 New daily persistent headache (NDPH): Secondary | ICD-10-CM

## 2021-05-24 DIAGNOSIS — R630 Anorexia: Secondary | ICD-10-CM

## 2021-05-24 DIAGNOSIS — R5383 Other fatigue: Secondary | ICD-10-CM

## 2021-05-24 NOTE — Progress Notes (Signed)
Virtual Visit via Video Note  I connected with Tyler Best on 05/24/21 at  4:30 PM EDT by a video enabled telemedicine application 2/2 COVID-19 pandemic and verified that I am speaking with the correct person using two identifiers.  Location patient: home Location provider:work or home office Persons participating in the virtual visit: patient, provider  I discussed the limitations of evaluation and management by telemedicine and the availability of in person appointments. The patient expressed understanding and agreed to proceed.  HPI: Pt has missed several shifts of work 2/2 migraines and nausea.  Pt enjoys going to work, so concerned that he has had to miss it.  Has FMLA forms.  Wakes up with a migraine around 4:30 am every morning since coming back from the beach. Noted as pounding behind eyes, blurred vision.  Makes him have to sit down.  Taking excedrin several times per wk.  Also has fatigue, dizziness, and diaphoresis.  Notes chills.   Pt has lost 10-15 lbs since last visit.  Notes decreased appetite.  Pt feels like symptoms have stayed the same since last visit.  Started Zoloft 25 mg qhs but has not noticed a difference.  Does not feel like he is sleeping that well since being on the med.  Taking melatonin.  Pt threw up bright red blood the other day.  At times has emesis but not with blood.  Denies stomach pain.  Notes decreased appetite and nausea.    Pt notes upper back pain, mid thoracic x several wks.    ROS: See pertinent positives and negatives per HPI.  No past medical history on file.  Past Surgical History:  Procedure Laterality Date   APPENDECTOMY      Family History  Problem Relation Age of Onset   Diabetes Father    Cancer Maternal Grandfather     Current Outpatient Medications:    allopurinol (ZYLOPRIM) 100 MG tablet, Take 100 mg by mouth daily., Disp: , Rfl:    allopurinol (ZYLOPRIM) 100 MG tablet, TAKE 1 TABLET BY MOUTH EVERY DAY, Disp: 30 tablet, Rfl:  2   allopurinol (ZYLOPRIM) 100 MG tablet, Take 1 tablet (100 mg total) by mouth daily., Disp: 90 tablet, Rfl: 1   ciprofloxacin-dexamethasone (CIPRODEX) OTIC suspension, Place 4 drops into the right ear 2 (two) times daily., Disp: 7.5 mL, Rfl: 0   colchicine 0.6 MG tablet, Take 1 tablet (0.6 mg total) by mouth 2 (two) times daily as needed., Disp: 60 tablet, Rfl: 1   predniSONE (DELTASONE) 10 MG tablet, Take 4 tabs every morning for 3 days, 3 tabs for 2 days, 2 tabs for 2 days, 1 tab for 1 day., Disp: 23 tablet, Rfl: 0   sertraline (ZOLOFT) 25 MG tablet, Take 1 tablet (25 mg total) by mouth daily., Disp: 30 tablet, Rfl: 2   diphenoxylate-atropine (LOMOTIL) 2.5-0.025 MG tablet, Take 2 tablets by mouth 4 (four) times daily as needed for diarrhea or loose stools. (Patient not taking: No sig reported), Disp: 60 tablet, Rfl: 0   ketoconazole (NIZORAL) 200 MG tablet, Take 1 tablet (200 mg total) by mouth in the morning and at bedtime. (Patient not taking: No sig reported), Disp: 60 tablet, Rfl: 0   ondansetron (ZOFRAN) 8 MG tablet, Take 1 tablet (8 mg total) by mouth every 6 (six) hours as needed for nausea or vomiting. (Patient not taking: No sig reported), Disp: 60 tablet, Rfl: 0  EXAM:  VITALS per patient if applicable: RR between 12-20 bpm  GENERAL: alert, oriented,  appears well, mildly anxious, in no acute distress  HEENT: atraumatic, conjunctiva clear, no obvious abnormalities on inspection of external nose and ears  NECK: normal movements of the head and neck  LUNGS: on inspection no signs of respiratory distress, breathing rate appears normal, no obvious gross SOB, gasping or wheezing  CV: no obvious cyanosis  MS: moves all visible extremities without noticeable abnormality  PSYCH/NEURO: pleasant and cooperative, no obvious depression or anxiety, speech and thought processing grossly intact  ASSESSMENT AND PLAN:  Discussed the following assessment and plan:  New persistent daily  headache  Non-intractable vomiting with nausea, unspecified vomiting type  Anxiety  Weight loss  Decreased appetite  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  Creating a list of all ongoing symptoms advised.  Discussed the imprortance of hydration.  Take small sips given n/v.  Parke Simmers diet as tolerated.  Stress reduction.  As with loss likely 2/2 decreased appetite cannot exclude malignancy given current chewing tobacco use.  Obtain CXR while awaiting in person eval.  Given strict precautions.   I discussed the assessment and treatment plan with the patient. The patient was provided an opportunity to ask questions and all were answered. The patient agreed with the plan and demonstrated an understanding of the instructions.   The patient was advised to call back or seek an in-person evaluation if the symptoms worsen or if the condition fails to improve as anticipated.    Deeann Saint, MD

## 2021-05-24 NOTE — Telephone Encounter (Signed)
Patient dropped off paperwork for FMLA that he would like Dr. Salomon Fick to complete. He has a virtual visit 05/24/21 at 4:30pm to discuss the paperwork he dropped off. He says that he would like to discuss it first with Dr. Salomon Fick before she completes it.  Patient would like forms to either be faxed to [phone] or mailed to: [facility] [address] [place], [place] [address]  Paperwork will be placed in folder.  Please advise.

## 2021-05-24 NOTE — Telephone Encounter (Signed)
Form rec'd, given to doctor.

## 2021-05-26 ENCOUNTER — Ambulatory Visit (INDEPENDENT_AMBULATORY_CARE_PROVIDER_SITE_OTHER)
Admission: RE | Admit: 2021-05-26 | Discharge: 2021-05-26 | Disposition: A | Discharge status: Home / Self Care | Payer: 59 | Source: Ambulatory Visit | Attending: Family Medicine | Admitting: Family Medicine

## 2021-05-26 ENCOUNTER — Other Ambulatory Visit: Payer: Self-pay

## 2021-05-26 DIAGNOSIS — R5383 Other fatigue: Secondary | ICD-10-CM | POA: Diagnosis not present

## 2021-05-26 DIAGNOSIS — F17220 Nicotine dependence, chewing tobacco, uncomplicated: Secondary | ICD-10-CM | POA: Diagnosis not present

## 2021-05-26 DIAGNOSIS — R634 Abnormal weight loss: Secondary | ICD-10-CM

## 2021-05-27 DIAGNOSIS — Z0279 Encounter for issue of other medical certificate: Secondary | ICD-10-CM

## 2021-05-27 NOTE — Telephone Encounter (Signed)
Form completed and faxed. 

## 2021-05-31 ENCOUNTER — Telehealth: Payer: Self-pay | Admitting: Family Medicine

## 2021-05-31 NOTE — Telephone Encounter (Signed)
fyi

## 2021-05-31 NOTE — Telephone Encounter (Signed)
Pt is calling in stating that the FMLA pw need some correction per the [facility] doctors and he needs #9 on the form episode should read daily instead of once a week and duration of a whole shift or 3-4 days instead of 3-4 hrs due to him working a 24 hr shift (see page 1 for work schedule).  Pt is stating that it means a lot that it is corrected so that he does not lose his job.  Pt is aware that this will not be taking care of until 06/02/2021 @ 4:30 p.m.

## 2021-06-02 ENCOUNTER — Ambulatory Visit: Payer: 59 | Admitting: Family Medicine

## 2021-06-03 ENCOUNTER — Other Ambulatory Visit: Payer: Self-pay

## 2021-06-03 ENCOUNTER — Ambulatory Visit: Payer: 59 | Admitting: Family Medicine

## 2021-06-03 ENCOUNTER — Encounter: Payer: Self-pay | Admitting: Family Medicine

## 2021-06-03 VITALS — BP 138/84 | HR 85 | Temp 98.7°F | Wt 190.6 lb

## 2021-06-03 DIAGNOSIS — F17220 Nicotine dependence, chewing tobacco, uncomplicated: Secondary | ICD-10-CM

## 2021-06-03 DIAGNOSIS — R112 Nausea with vomiting, unspecified: Secondary | ICD-10-CM

## 2021-06-03 DIAGNOSIS — F419 Anxiety disorder, unspecified: Secondary | ICD-10-CM | POA: Diagnosis not present

## 2021-06-03 DIAGNOSIS — K219 Gastro-esophageal reflux disease without esophagitis: Secondary | ICD-10-CM | POA: Diagnosis not present

## 2021-06-03 DIAGNOSIS — R42 Dizziness and giddiness: Secondary | ICD-10-CM

## 2021-06-03 DIAGNOSIS — R634 Abnormal weight loss: Secondary | ICD-10-CM | POA: Diagnosis not present

## 2021-06-03 DIAGNOSIS — F320 Major depressive disorder, single episode, mild: Secondary | ICD-10-CM

## 2021-06-03 DIAGNOSIS — I4510 Unspecified right bundle-branch block: Secondary | ICD-10-CM

## 2021-06-03 LAB — POCT URINALYSIS DIPSTICK
Bilirubin, UA: NEGATIVE
Blood, UA: NEGATIVE
Glucose, UA: NEGATIVE
Ketones, UA: NEGATIVE
Leukocytes, UA: NEGATIVE
Nitrite, UA: NEGATIVE
Protein, UA: NEGATIVE
Spec Grav, UA: <=1.005 — AB (ref 1.010–1.025)
Urobilinogen, UA: NEGATIVE E.U./dL — AB
pH, UA: 6 (ref 5.0–8.0)

## 2021-06-03 MED ORDER — SERTRALINE HCL 50 MG PO TABS: 50.0000 mg | ORAL_TABLET | Freq: Every day | ORAL | 3 refills | Status: DC

## 2021-06-03 MED ORDER — OMEPRAZOLE 40 MG PO CPDR: 40.0000 mg | DELAYED_RELEASE_CAPSULE | Freq: Every day | ORAL | 3 refills | Status: AC

## 2021-06-03 NOTE — Progress Notes (Signed)
Subjective:    Patient ID: Tyler Best, male    DOB: 07-28-1986, 35 y.o.   MRN: [id number]  Chief Complaint  Patient presents with   Follow-up    HPI Patient was seen today for f/u on ongoing concerns.  Pt with increased nausea and vomiting every morning times the last 3 days.  At times prevents pt from going to work.  Patient tried to go to work today despite feeling bad but was sent home after he got sick while here.  Patient requesting FMLA form be corrected to reflect the amount of time he has been missing from work.  States he may have missed the last 7 of 9 shifts 2/2 weakness, lightheadedness, nausea, vomiting.  Notes weight loss as unable to eat much.  Denies abd pain, diarrhea, constipation, taking NSAIDs on an empty stomach.  Taking OTC reflux med.  Family h/o hiatal hernia in Mom and a few other family members.  Since last visit pt mentions he did not stop Zoloft 25 mg as discussed.  Pt had an appt to establish with a counselor, however he missed the appt yesterday.  Plans to reschedule.  Uses chewing tobacco.  No past medical history on file.  No Known Allergies  ROS General: Denies fever, chills, night sweats, changes in weight, changes in appetite +lightheadedness, weight loss HEENT: Denies ear pain, changes in vision, rhinorrhea, sore throat +HAs CV: Denies CP, palpitations, SOB, orthopnea Pulm: Denies SOB, cough, wheezing GI: Denies abdominal pain, diarrhea, constipation  +n/v GU: Denies dysuria, hematuria, frequency Msk: Denies muscle cramps, joint pains Neuro: Denies weakness, numbness, tingling Skin: Denies rashes, bruising Psych: Denies hallucinations +anxiety, depression    Objective:    Blood pressure 138/84, pulse 85, temperature 98.7 F (37.1 C), temperature source Oral, weight 190 lb 9.6 oz (86.5 kg), SpO2 95 %.  Gen. Pleasant, well-nourished, in no distress, normal affect   HEENT: [place]/AT, face symmetric, conjunctiva clear, no scleral icterus,  PERRLA, EOMI, nares patent without drainage, pharynx without erythema or exudate. Neck: No JVD, no thyromegaly, no carotid bruits Lungs: no accessory muscle use, CTAB, no wheezes or rales Cardiovascular: RRR, no m/r/g, no peripheral edema Abdomen: BS present, soft, NT/ND, no hepatosplenomegaly. Musculoskeletal: No deformities, no cyanosis or clubbing, normal tone Neuro:  A&Ox3, CN II-XII intact, normal gait Skin:  Warm, no lesions/ rash  Wt Readings from Last 3 Encounters:  06/03/21 190 lb 9.6 oz (86.5 kg)  04/14/21 195 lb 3.2 oz (88.5 kg)  12/09/20 192 lb (87.1 kg)    Lab Results  Component Value Date   WBC 5.2 04/08/2021   HGB 15.8 04/08/2021   HCT 44.8 04/08/2021   PLT 186.0 04/08/2021   GLUCOSE 86 04/08/2021   ALT 32 04/08/2021   AST 33 04/08/2021   NA 136 04/08/2021   K 3.7 04/08/2021   CL 97 04/08/2021   CREATININE 1.29 04/08/2021   BUN 7 04/08/2021   CO2 28 04/08/2021   TSH 2.37 04/08/2021   HGBA1C 4.9 04/08/2021    Assessment/Plan:  Non-intractable vomiting with nausea, unspecified vomiting type -Consider possible causes including GERD, ulcer though without pain, ICP, thyroid dz though TSH previously normal on 04/08/2021, NSAIDs, mass, anxiety, depression, substance use -Zofran as needed -Discussed the importance of hydration.  Trying eating bland food/small meals - Plan: Ambulatory referral to Gastroenterology, POCT urinalysis dipstick  Lightheaded -increase po hydration -Holter monitor -Plan: Ambulatory referral to Cardiology  Anxiety  -GAD-7 score 7 -Advised to reschedule counseling appointment missed yesterday. -  Plan: TSH, T4, Free, sertraline (ZOLOFT) 50 MG tablet  Gastroesophageal reflux disease, unspecified whether esophagitis present -Discussed possible causes including hiatal hernia.  Explained in detail, illustration drawn. 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 - Plan: sertraline (ZOLOFT) 50 MG tablet  FMLA form revised.  Patient encouraged to keep all appointments.  Also stressed the importance of compliance.  F/u in the next 4-6 weeks, sooner if needed  Abbe Amsterdam, MD

## 2021-06-04 LAB — CBC WITH DIFFERENTIAL/PLATELET
Basophils Absolute: 0 10*3/uL (ref 0.0–0.1)
Basophils Relative: 0.6 % (ref 0.0–3.0)
Eosinophils Absolute: 0 10*3/uL (ref 0.0–0.7)
Eosinophils Relative: 0.3 % (ref 0.0–5.0)
HCT: 45.1 % (ref 39.0–52.0)
Hemoglobin: 15.9 g/dL (ref 13.0–17.0)
Lymphocytes Relative: 29.7 % (ref 12.0–46.0)
Lymphs Abs: 1.9 10*3/uL (ref 0.7–4.0)
MCHC: 35.2 g/dL (ref 30.0–36.0)
MCV: 93.4 fl (ref 78.0–100.0)
Monocytes Absolute: 0.5 10*3/uL (ref 0.1–1.0)
Monocytes Relative: 8.1 % (ref 3.0–12.0)
Neutro Abs: 4 10*3/uL (ref 1.4–7.7)
Neutrophils Relative %: 61.3 % (ref 43.0–77.0)
Platelets: 208 10*3/uL (ref 150.0–400.0)
RBC: 4.83 Mil/uL (ref 4.22–5.81)
RDW: 13.5 % (ref 11.5–15.5)
WBC: 6.5 10*3/uL (ref 4.0–10.5)

## 2021-06-04 LAB — COMPREHENSIVE METABOLIC PANEL
ALT: 34 U/L (ref 0–53)
AST: 47 U/L — ABNORMAL HIGH (ref 0–37)
Albumin: 4.8 g/dL (ref 3.5–5.2)
Alkaline Phosphatase: 95 U/L (ref 39–117)
BUN: 11 mg/dL (ref 6–23)
CO2: 22 mEq/L (ref 19–32)
Calcium: 9.5 mg/dL (ref 8.4–10.5)
Chloride: 99 mEq/L (ref 96–112)
Creatinine, Ser: 1.2 mg/dL (ref 0.40–1.50)
GFR: 77.26 mL/min (ref >60.00)
Glucose, Bld: 103 mg/dL — ABNORMAL HIGH (ref 70–99)
Potassium: 3.4 mEq/L — ABNORMAL LOW (ref 3.5–5.1)
Sodium: 138 mEq/L (ref 135–145)
Total Bilirubin: 1.3 mg/dL — ABNORMAL HIGH (ref 0.2–1.2)
Total Protein: 7.4 g/dL (ref 6.0–8.3)

## 2021-06-04 LAB — T4, FREE: Free T4: 0.76 ng/dL (ref 0.60–1.60)

## 2021-06-04 LAB — LIPASE: Lipase: 20 U/L (ref 11.0–59.0)

## 2021-06-04 LAB — TSH: TSH: 2.86 u[IU]/mL (ref 0.35–5.50)

## 2021-06-04 LAB — VITAMIN B12: Vitamin B-12: 226 pg/mL (ref 211–911)

## 2021-06-07 ENCOUNTER — Other Ambulatory Visit: Payer: Self-pay

## 2021-06-07 ENCOUNTER — Ambulatory Visit (INDEPENDENT_AMBULATORY_CARE_PROVIDER_SITE_OTHER): Payer: 59 | Admitting: Gastroenterology

## 2021-06-07 ENCOUNTER — Encounter: Payer: Self-pay | Admitting: Gastroenterology

## 2021-06-07 VITALS — BP 112/84 | HR 59 | Ht 70.0 in | Wt 193.5 lb

## 2021-06-07 DIAGNOSIS — R142 Eructation: Secondary | ICD-10-CM | POA: Diagnosis not present

## 2021-06-07 DIAGNOSIS — R630 Anorexia: Secondary | ICD-10-CM

## 2021-06-07 DIAGNOSIS — R5383 Other fatigue: Secondary | ICD-10-CM

## 2021-06-07 DIAGNOSIS — R112 Nausea with vomiting, unspecified: Secondary | ICD-10-CM

## 2021-06-07 DIAGNOSIS — R12 Heartburn: Secondary | ICD-10-CM

## 2021-06-07 DIAGNOSIS — K219 Gastro-esophageal reflux disease without esophagitis: Secondary | ICD-10-CM | POA: Diagnosis not present

## 2021-06-07 DIAGNOSIS — R42 Dizziness and giddiness: Secondary | ICD-10-CM

## 2021-06-07 NOTE — Progress Notes (Signed)
Chief Complaint: Nausea/vomiting, decreased appetite, heartburn   Referring Provider:     Deeann Saint, MD   HPI:     Tyler Best is a 35 y.o. male with a history of depression, anxiety, referred to the Gastroenterology Clinic for evaluation of nausea/vomiting and decreased appetite.  States he has been having increasing nausea/vomiting, typically in the mornings, for the last 3-4 weeks.  Has tried taking OTC reflux medication without much improvement.  Was seen by his PCM for this issue on 06/03/2021, started on Prilosec 40 mg/day (he has not picked up this Rx yet), labs checked as below, and referred to GI.  Was additionally referred to Cardiology for history of incomplete RBBB and lightheadedness.  - 06/03/2021: AST 47, K3.4, T bili 1.3, otherwise normal CMP.  Normal CBC, lipase, TSH, T4.  B12 low/normal at 226  Today, he states sxs actually started 3 months ago w/ intemrittent lightheadedness and n/v. No fevers. Sxs started getting progressively more frequent. Was then diagnosed with anxiety/stress by his PCM and started on Zoloft approx 4 weeks ago. Has had some overall improvement, but still with AM n/v and lightheadedness. Has improved with Zofran prn prescribed last week.   Separately, history of reflux since his early 40s, with index symptoms of HB, belching, regurgitation. Previously used Zantac then moved to OTC antacids, which he takes daily. No dysphagia.   Also with altered sleep/not sleeping for last several months as well.  Does report reduced appetite, with undulating weight between 183-195#.  No previous EGD or colonoscopy.  Aside from appendectomy, no prior abdominal surgery.  History reviewed. No pertinent past medical history.   Past Surgical History:  Procedure Laterality Date   APPENDECTOMY     Family History  Problem Relation Age of Onset   Diabetes Father    Cancer Maternal Grandfather    Colon cancer Neg Hx    Pancreatic cancer  Neg Hx    Stomach cancer Neg Hx    Liver disease Neg Hx    Esophageal cancer Neg Hx    Social History   Tobacco Use   Smoking status: Never   Smokeless tobacco: Current    Types: Snuff  Vaping Use   Vaping Use: Never used  Substance Use Topics   Alcohol use: Yes    Alcohol/week: 6.0 standard drinks    Types: 6 Cans of beer per week   Drug use: No   Current Outpatient Medications  Medication Sig Dispense Refill   allopurinol (ZYLOPRIM) 100 MG tablet Take 100 mg by mouth daily.     colchicine 0.6 MG tablet Take 1 tablet (0.6 mg total) by mouth 2 (two) times daily as needed. 60 tablet 1   omeprazole (PRILOSEC) 40 MG capsule Take 1 capsule (40 mg total) by mouth daily. 30 capsule 3   ondansetron (ZOFRAN) 8 MG tablet Take 1 tablet (8 mg total) by mouth every 6 (six) hours as needed for nausea or vomiting. 60 tablet 0   predniSONE (DELTASONE) 10 MG tablet Take 4 tabs every morning for 3 days, 3 tabs for 2 days, 2 tabs for 2 days, 1 tab for 1 day. 23 tablet 0   sertraline (ZOLOFT) 50 MG tablet Take 1 tablet (50 mg total) by mouth daily. 30 tablet 3   No current facility-administered medications for this visit.   No Known Allergies   Review of Systems: All systems reviewed and negative except where  noted in HPI.     Physical Exam:    Wt Readings from Last 3 Encounters:  06/07/21 193 lb 8 oz (87.8 kg)  06/03/21 190 lb 9.6 oz (86.5 kg)  04/14/21 195 lb 3.2 oz (88.5 kg)    BP 112/84   Pulse (!) 59   Ht 5\' 10"  (1.778 m)   Wt 193 lb 8 oz (87.8 kg)   SpO2 98%   BMI 27.76 kg/m  Constitutional:  Pleasant, in no acute distress. Psychiatric: Normal mood and affect. Behavior is normal. EENT: Pupils normal.  Conjunctivae are normal. No scleral icterus. Neck supple. No cervical LAD. Cardiovascular: Normal rate, regular rhythm. No edema Pulmonary/chest: Effort normal and breath sounds normal. No wheezing, rales or rhonchi. Abdominal: Soft, nondistended, nontender. Bowel sounds  active throughout. There are no masses palpable. No hepatomegaly. Neurological: Alert and oriented to person place and time. Skin: Skin is warm and dry. No rashes noted.   ASSESSMENT AND PLAN;   1) GERD 2) Heartburn 3) Belching 4) Nausea/vomiting 5) Reduced appetite  - Discussed trial of high-dose PPI vs endoscopic evaluation, he strongly prefers the latter - EGD to evaluate for mucosal/luminal pathology to include reflux changes, erosive esophagitis, hiatal hernia, PUD, gastritis, GOO, etc. - Schedule EGD after upcoming Cardiology appointment on 06/17/2021 - If work-up unrevealing and symptoms persist, CT head +/- CT abdomen/pelvis  6) Lightheadedness 7) History of RBBB 8) Fatigue - Has appointment with Dr. 08/17/2021 in Cardiology clinic  The indications, risks, and benefits of EGD were explained to the patient in detail. Risks include but are not limited to bleeding, perforation, adverse reaction to medications, and cardiopulmonary compromise. Sequelae include but are not limited to the possibility of surgery, hospitalization, and mortality. The patient verbalized understanding and wished to proceed. All questions answered, referred to scheduler. Further recommendations pending results of the exam.    Servando Salina, DO, FACG  06/07/2021, 8:36 AM   06/09/2021, MD

## 2021-06-07 NOTE — Patient Instructions (Addendum)
If you are age 37 or older, your body mass index should be between 23-30. Your Body mass index is 27.76 kg/m. If this is out of the aforementioned range listed, please consider follow up with your Primary Care Provider.  If you are age 51 or younger, your body mass index should be between 19-25. Your Body mass index is 27.76 kg/m. If this is out of the aformentioned range listed, please consider follow up with your Primary Care Provider.   .Due to recent changes in healthcare laws, you may see the results of your imaging and laboratory studies on MyChart before your provider has had a chance to review them.  We understand that in some cases there may be results that are confusing or concerning to you. Not all laboratory results come back in the same time frame and the provider may be waiting for multiple results in order to interpret others.  Please give [place] 48 hours in order for your provider to thoroughly review all the results before contacting the office for clarification of your results.   Thank you for choosing me and  Gastroenterology.  Vito Cirigliano, D.O.

## 2021-06-08 ENCOUNTER — Other Ambulatory Visit: Payer: Self-pay | Admitting: Family Medicine

## 2021-06-08 ENCOUNTER — Encounter: Payer: Self-pay | Admitting: Gastroenterology

## 2021-06-08 ENCOUNTER — Encounter: Payer: Self-pay | Admitting: Family Medicine

## 2021-06-08 DIAGNOSIS — E876 Hypokalemia: Secondary | ICD-10-CM

## 2021-06-08 MED ORDER — POTASSIUM CHLORIDE CRYS ER 20 MEQ PO TBCR: 20.0000 meq | EXTENDED_RELEASE_TABLET | Freq: Every day | ORAL | 0 refills | Status: AC

## 2021-06-10 ENCOUNTER — Telehealth: Payer: Self-pay | Admitting: Family Medicine

## 2021-06-10 NOTE — Telephone Encounter (Signed)
Patient brought in FMLA forms to have Dr. Salomon Fick to complete at his visit on 07/21. He was calling to follow up on the forms to see if they are completed yet.  Please advise

## 2021-06-14 NOTE — Telephone Encounter (Signed)
Revised FMLA forms were faxed on 7/29.

## 2021-06-15 ENCOUNTER — Encounter: Payer: Self-pay | Admitting: Cardiology

## 2021-06-15 ENCOUNTER — Ambulatory Visit: Payer: 59 | Admitting: Cardiology

## 2021-06-15 ENCOUNTER — Other Ambulatory Visit: Payer: Self-pay

## 2021-06-15 VITALS — BP 118/82 | HR 57 | Ht 69.0 in | Wt 194.0 lb

## 2021-06-15 DIAGNOSIS — R5383 Other fatigue: Secondary | ICD-10-CM

## 2021-06-15 DIAGNOSIS — E782 Mixed hyperlipidemia: Secondary | ICD-10-CM

## 2021-06-15 DIAGNOSIS — E781 Pure hyperglyceridemia: Secondary | ICD-10-CM

## 2021-06-15 DIAGNOSIS — R001 Bradycardia, unspecified: Secondary | ICD-10-CM

## 2021-06-15 DIAGNOSIS — E785 Hyperlipidemia, unspecified: Secondary | ICD-10-CM | POA: Insufficient documentation

## 2021-06-15 DIAGNOSIS — R0789 Other chest pain: Secondary | ICD-10-CM

## 2021-06-15 NOTE — Progress Notes (Signed)
Cardiology Office Note:    Date:  06/15/2021   ID:  Tyler Best, DOB 12-27-1985, MRN [id number]  PCP:  Deeann Saint, MD  Cardiologist:  None  Electrophysiologist:  None   Referring MD: Deeann Saint, MD   Chief Complaint  Patient presents with   Clearance 06/18/21    Endoscopy Dr. Barron Alvine    History of Present Illness:    Tyler Best is a 35 y.o. male with a hx of hyperlipidemia, hypertriglyceridemia, gout and anxiety here today to be evaluated for chest discomfort, slow heart rate as well as fatigue and dizziness.  The patient tells me he has had slow heart rate for as long as he can remember.  But recently he has had episodes of intermittent chest discomfort.  He described as a midsternal chest pressure which comes and goes.  He notes that sometimes he feels abdominal epigastric pain.  He does admit to some nausea and vomiting at times as well.  What also he tells me recently he has been significantly fatigued and noticed that he has had some dizziness.  But he tells me the dizziness usually is after he has had vomiting.  He has family history of premature coronary artery disease in his father.  No other complaints at this time.  History reviewed. No pertinent past medical history.  Past Surgical History:  Procedure Laterality Date   APPENDECTOMY      Current Medications: Current Meds  Medication Sig   allopurinol (ZYLOPRIM) 100 MG tablet Take 100 mg by mouth daily.   colchicine 0.6 MG tablet Take 1 tablet (0.6 mg total) by mouth 2 (two) times daily as needed. (Patient taking differently: Take 0.6 mg by mouth 2 (two) times daily as needed (gout).)   omeprazole (PRILOSEC) 40 MG capsule Take 1 capsule (40 mg total) by mouth daily.   ondansetron (ZOFRAN) 8 MG tablet Take 1 tablet (8 mg total) by mouth every 6 (six) hours as needed for nausea or vomiting.   potassium chloride SA (KLOR-CON) 20 MEQ tablet Take 1 tablet (20 mEq total) by mouth daily for 5  days.   sertraline (ZOLOFT) 50 MG tablet Take 1 tablet (50 mg total) by mouth daily.     Allergies:   Patient has no known allergies.   Social History   Socioeconomic History   Marital status: Married    Spouse name: Not on file   Number of children: 2   Years of education: Not on file   Highest education level: Not on file  Occupational History   Not on file  Tobacco Use   Smoking status: Never   Smokeless tobacco: Current    Types: Snuff  Vaping Use   Vaping Use: Never used  Substance and Sexual Activity   Alcohol use: Yes    Alcohol/week: 6.0 standard drinks    Types: 6 Cans of beer per week   Drug use: No   Sexual activity: Yes    Birth control/protection: None  Other Topics Concern   Not on file  Social History Narrative   Not on file   Social Determinants of Health   Financial Resource Strain: Not on file  Food Insecurity: Not on file  Transportation Needs: Not on file  Physical Activity: Not on file  Stress: Not on file  Social Connections: Not on file     Family History: The patient's family history includes Cancer in his maternal grandfather; Diabetes in his father. There is no history of  Colon cancer, Pancreatic cancer, Stomach cancer, Liver disease, or Esophageal cancer.  ROS:   Review of Systems  Constitution: Negative for decreased appetite, fever and weight gain.  HENT: Negative for congestion, ear discharge, hoarse voice and sore throat.   Eyes: Negative for discharge, redness, vision loss in right eye and visual halos.  Cardiovascular: Negative for chest pain, dyspnea on exertion, leg swelling, orthopnea and palpitations.  Respiratory: Negative for cough, hemoptysis, shortness of breath and snoring.   Endocrine: Negative for heat intolerance and polyphagia.  Hematologic/Lymphatic: Negative for bleeding problem. Does not bruise/bleed easily.  Skin: Negative for flushing, nail changes, rash and suspicious lesions.  Musculoskeletal: Negative for  arthritis, joint pain, muscle cramps, myalgias, neck pain and stiffness.  Gastrointestinal: Negative for abdominal pain, bowel incontinence, diarrhea and excessive appetite.  Genitourinary: Negative for decreased libido, genital sores and incomplete emptying.  Neurological: Negative for brief paralysis, focal weakness, headaches and loss of balance.  Psychiatric/Behavioral: Negative for altered mental status, depression and suicidal ideas.  Allergic/Immunologic: Negative for HIV exposure and persistent infections.    EKGs/Labs/Other Studies Reviewed:    The following studies were reviewed today:   EKG:  The ekg ordered today demonstrates sinus bradycardia, heart rate 59 bpm.  Recent Labs: 06/03/2021: ALT 34; BUN 11; Creatinine, Ser 1.20; Hemoglobin 15.9; Platelets 208.0; Potassium 3.4; Sodium 138; TSH 2.86  Recent Lipid Panel No results found for: CHOL, TRIG, HDL, CHOLHDL, VLDL, LDLCALC, LDLDIRECT  Physical Exam:    VS:  BP 118/82 (BP Location: Right Arm, Patient Position: Sitting)   Pulse (!) 57   Ht 5\' 9"  (1.753 m)   Wt 194 lb (88 kg)   SpO2 99%   BMI 28.65 kg/m     Wt Readings from Last 3 Encounters:  06/15/21 194 lb (88 kg)  06/07/21 193 lb 8 oz (87.8 kg)  06/03/21 190 lb 9.6 oz (86.5 kg)     GEN: Well nourished, well developed in no acute distress HEENT: Normal NECK: No JVD; No carotid bruits LYMPHATICS: No lymphadenopathy CARDIAC: S1S2 noted,RRR, no murmurs, rubs, gallops RESPIRATORY:  Clear to auscultation without rales, wheezing or rhonchi  ABDOMEN: Soft, non-tender, non-distended, +bowel sounds, no guarding. EXTREMITIES: No edema, No cyanosis, no clubbing MUSCULOSKELETAL:  No deformity  SKIN: Warm and dry NEUROLOGIC:  Alert and oriented x 3, non-focal PSYCHIATRIC:  Normal affect, good insight  ASSESSMENT:    1. Chest discomfort   2. Hypertriglyceridemia   3. Mixed hyperlipidemia   4. Other fatigue   5. Sinus bradycardia    PLAN:     His chest  discomfort is concerning given his risk factors hyperlipidemia, hypertriglyceridemia as well as family history of premature coronary artery disease in his family.  Like to pursue an ischemic evaluation in this patient-he is low to intermediate risk.  Therefore stress echo will be appropriate at this time.  If all of his testing are normal the patient should be able to proceed with his colonoscopy/endoscopy.  This also help 06/05/21 to understand his heart rate.  I doubt sinus node dysfunction in this young patient given his sinus bradycardia but making his heart rate go up to his maximal heart rate based on age will be beneficial as well.  His dizziness is associated with his vomiting and that could be suspected to be some underlying inner ear dysfunction but for right now he is working with his PCP on this.  I reviewed his lipid profile which was done in February 2022 HDL 62, LDL 160, total  cholesterol 264, triglyceride 255-the patient was not aware of the fact that he does have hyperlipidemia and hypertriglyceridemia based on these recordings.  We are going to get repeat blood work and I have advised the patient should this still be elevated he will benefit from starting on a statin.  Recent thyroid function was normal.  The patient is in agreement with the above plan. The patient left the office in stable condition.  The patient will follow up in 3 months.   Medication Adjustments/Labs and Tests Ordered: Current medicines are reviewed at length with the patient today.  Concerns regarding medicines are outlined above.  Orders Placed This Encounter  Procedures   Lipid Profile   HgB A1c   EKG 12-Lead   ECHOCARDIOGRAM STRESS TEST   No orders of the defined types were placed in this encounter.   Patient Instructions  Medication Instructions:  Your physician recommends that you continue on your current medications as directed. Please refer to the Current Medication list given to you today.  *If  you need a refill on your cardiac medications before your next appointment, please call your pharmacy*   Lab Work: Your physician recommends that you return for lab work in: TODAY Lipids, HGB A1C If you have labs (blood work) drawn today and your tests are completely normal, you will receive your results only by: MyChart Message (if you have MyChart) OR A paper copy in the mail If you have any lab test that is abnormal or we need to change your treatment, we will call you to review the results.   Testing/Procedures: Your physician has requested that you have a stress echocardiogram. For further information please visit [email or web address] Please follow instruction sheet as given.  No food or drink after midnight prior to your test   l Hold all medications the morning of your test.   l Bring all medications with you to your appointment.  l Hold all oral diabetic medications the morning of your study.  l Dress in comfortable clothes and wear walking shoes.   l No flip flops or bedroom slippers, tennis shoes are preferred.  l Wear clothing that is easily accessible to chest wall.  IV may be required for some tests.    Follow-Up: At [facility], you and your health needs are our priority.  As part of our continuing mission to provide you with exceptional heart care, we have created designated Provider Care Teams.  These Care Teams include your primary Cardiologist (physician) and Advanced Practice Providers (APPs -  Physician Assistants and Nurse Practitioners) who all work together to provide you with the care you need, when you need it.  We recommend signing up for the patient portal called "MyChart".  Sign up information is provided on this After Visit Summary.  MyChart is used to connect with patients for Virtual Visits (Telemedicine).  Patients are able to view lab/test results, encounter notes, upcoming appointments, etc.  Non-urgent messages can be sent to your provider as  well.   To learn more about what you can do with MyChart, go to [email or web address].    Your next appointment:   6 month(s)  The format for your next appointment:   In Person  Provider:   Thomasene Ripple, DO   Other Instructions    Adopting a Healthy Lifestyle.  Know what a healthy weight is for you (roughly BMI <25) and aim to maintain this   Aim for 7+ servings of fruits and vegetables daily   65-80+ fluid  ounces of water or unsweet tea for healthy kidneys   Limit to max 1 drink of alcohol per day; avoid smoking/tobacco   Limit animal fats in diet for cholesterol and heart health - choose grass fed whenever available   Avoid highly processed foods, and foods high in saturated/trans fats   Aim for low stress - take time to unwind and care for your mental health   Aim for 150 min of moderate intensity exercise weekly for heart health, and weights twice weekly for bone health   Aim for 7-9 hours of sleep daily   When it comes to diets, agreement about the perfect plan isnt easy to find, even among the experts. Experts at the [employer] developed an idea known as the Healthy Eating Plate. Just imagine a plate divided into logical, healthy portions.   The emphasis is on diet quality:   Load up on vegetables and fruits - one-half of your plate: Aim for color and variety, and remember that potatoes dont count.   Go for whole grains - one-quarter of your plate: Whole wheat, barley, wheat berries, quinoa, oats, brown rice, and foods made with them. If you want pasta, go with whole wheat pasta.   Protein power - one-quarter of your plate: Fish, chicken, beans, and nuts are all healthy, versatile protein sources. Limit red meat.   The diet, however, does go beyond the plate, offering a few other suggestions.   Use healthy plant oils, such as olive, canola, soy, corn, sunflower and peanut. Check the labels, and avoid partially hydrogenated oil, which have  unhealthy trans fats.   If youre thirsty, drink water. Coffee and tea are good in moderation, but skip sugary drinks and limit milk and dairy products to one or two daily servings.   The type of carbohydrate in the diet is more important than the amount. Some sources of carbohydrates, such as vegetables, fruits, whole grains, and beans-are healthier than others.   Finally, stay active  Signed, Thomasene RippleKardie Levy Wellman, DO  06/15/2021 11:13 AM    [facility]

## 2021-06-15 NOTE — Patient Instructions (Addendum)
Medication Instructions:  Your physician recommends that you continue on your current medications as directed. Please refer to the Current Medication list given to you today.  *If you need a refill on your cardiac medications before your next appointment, please call your pharmacy*   Lab Work: Your physician recommends that you return for lab work in: TODAY Lipids, HGB A1C If you have labs (blood work) drawn today and your tests are completely normal, you will receive your results only by: MyChart Message (if you have MyChart) OR A paper copy in the mail If you have any lab test that is abnormal or we need to change your treatment, we will call you to review the results.   Testing/Procedures: Your physician has requested that you have a stress echocardiogram. For further information please visit [email or web address] Please follow instruction sheet as given.  No food or drink after midnight prior to your test   l Hold all medications the morning of your test.   l Bring all medications with you to your appointment.  l Hold all oral diabetic medications the morning of your study.  l Dress in comfortable clothes and wear walking shoes.   l No flip flops or bedroom slippers, tennis shoes are preferred.  l Wear clothing that is easily accessible to chest wall.  IV may be required for some tests.    Follow-Up: At [facility], you and your health needs are our priority.  As part of our continuing mission to provide you with exceptional heart care, we have created designated Provider Care Teams.  These Care Teams include your primary Cardiologist (physician) and Advanced Practice Providers (APPs -  Physician Assistants and Nurse Practitioners) who all work together to provide you with the care you need, when you need it.  We recommend signing up for the patient portal called "MyChart".  Sign up information is provided on this After Visit Summary.  MyChart is used to connect with  patients for Virtual Visits (Telemedicine).  Patients are able to view lab/test results, encounter notes, upcoming appointments, etc.  Non-urgent messages can be sent to your provider as well.   To learn more about what you can do with MyChart, go to [email or web address].    Your next appointment:   6 month(s)  The format for your next appointment:   In Person  Provider:   Thomasene Ripple, DO   Other Instructions

## 2021-06-16 ENCOUNTER — Telehealth: Payer: Self-pay

## 2021-06-16 DIAGNOSIS — R079 Chest pain, unspecified: Secondary | ICD-10-CM

## 2021-06-16 LAB — LIPID PANEL
Chol/HDL Ratio: 4.1 ratio (ref 0.0–5.0)
Cholesterol, Total: 259 mg/dL — ABNORMAL HIGH (ref 100–199)
HDL: 63 mg/dL (ref >39)
LDL Chol Calc (NIH): 154 mg/dL — ABNORMAL HIGH (ref 0–99)
Triglycerides: 229 mg/dL — ABNORMAL HIGH (ref 0–149)
VLDL Cholesterol Cal: 42 mg/dL — ABNORMAL HIGH (ref 5–40)

## 2021-06-16 LAB — HEMOGLOBIN A1C
Est. average glucose Bld gHb Est-mCnc: 97 mg/dL
Hgb A1c MFr Bld: 5 % (ref 4.8–5.6)

## 2021-06-16 MED ORDER — ROSUVASTATIN CALCIUM 10 MG PO TABS: 10.0000 mg | ORAL_TABLET | Freq: Every day | ORAL | 2 refills | Status: DC

## 2021-06-16 NOTE — Telephone Encounter (Signed)
-----   Message from Thomasene Ripple, DO sent at 06/16/2021  1:34 PM EDT ----- Requested for stat stress echo for this patient at [facility] please see if this has been scheduled.

## 2021-06-16 NOTE — Telephone Encounter (Signed)
-----   Message from Thomasene Ripple, DO sent at 06/16/2021  4:31 PM EDT ----- Please let her know that I reviewed her stress echo which was done today and he is cleared to get his endoscopy/colonoscopy. ----- Message ----- From: Heywood Bene, CMA Sent: 06/16/2021   3:33 PM EDT To: Thomasene Ripple, DO  Patient results available through [facility]. Patient asked weather is cleared or not for his procedure. Please advise ----- Message ----- From: Thomasene Ripple, DO Sent: 06/16/2021   1:34 PM EDT To: Reynolds Bowl, RN  Requested for stat stress echo for this patient at [facility] please see if this has been scheduled.

## 2021-06-16 NOTE — Telephone Encounter (Signed)
Patient notified of results and recommendations. Agrees with plan. Medication sent. Stress test done and results are available through [facility].  Dr. Servando Salina has been notified

## 2021-06-16 NOTE — Telephone Encounter (Signed)
Patient notified via VM(ok per DPR) and Dr. Salomon Fick team notified cleared to proceed with procedure

## 2021-06-17 ENCOUNTER — Encounter: Payer: Self-pay | Admitting: Certified Registered Nurse Anesthetist

## 2021-06-18 ENCOUNTER — Ambulatory Visit (AMBULATORY_SURGERY_CENTER): Payer: 59 | Admitting: Gastroenterology

## 2021-06-18 ENCOUNTER — Other Ambulatory Visit: Payer: Self-pay

## 2021-06-18 ENCOUNTER — Encounter: Payer: Self-pay | Admitting: Gastroenterology

## 2021-06-18 VITALS — BP 125/82 | HR 59 | Temp 98.0°F | Resp 19 | Ht 70.0 in | Wt 193.0 lb

## 2021-06-18 DIAGNOSIS — K2970 Gastritis, unspecified, without bleeding: Secondary | ICD-10-CM | POA: Diagnosis not present

## 2021-06-18 DIAGNOSIS — R12 Heartburn: Secondary | ICD-10-CM

## 2021-06-18 DIAGNOSIS — K2990 Gastroduodenitis, unspecified, without bleeding: Secondary | ICD-10-CM

## 2021-06-18 DIAGNOSIS — R630 Anorexia: Secondary | ICD-10-CM

## 2021-06-18 DIAGNOSIS — K2100 Gastro-esophageal reflux disease with esophagitis, without bleeding: Secondary | ICD-10-CM | POA: Diagnosis present

## 2021-06-18 DIAGNOSIS — K319 Disease of stomach and duodenum, unspecified: Secondary | ICD-10-CM

## 2021-06-18 DIAGNOSIS — R112 Nausea with vomiting, unspecified: Secondary | ICD-10-CM

## 2021-06-18 HISTORY — PX: UPPER GASTROINTESTINAL ENDOSCOPY: SHX188

## 2021-06-18 MED ORDER — OMEPRAZOLE 40 MG PO CPDR: 40.0000 mg | DELAYED_RELEASE_CAPSULE | Freq: Two times a day (BID) | ORAL | 3 refills | Status: DC

## 2021-06-18 MED ORDER — SODIUM CHLORIDE 0.9 % IV SOLN: 500.0000 mL | Freq: Once | INTRAVENOUS | Status: DC

## 2021-06-18 NOTE — Progress Notes (Signed)
Called to room to assist during endoscopic procedure.  Patient ID and intended procedure confirmed with present staff. Received instructions for my participation in the procedure from the performing physician.  

## 2021-06-18 NOTE — Op Note (Signed)
[facility] Patient Name: Tyler Best Procedure Date: 06/18/2021 8:04 AM MRN: [id number] Endoscopist: Doristine Locks , MD Age: 35 Referring MD:  Date of Birth: [birth year] Gender: Male Account #: [id number] Procedure:                Upper GI endoscopy Indications:              Heartburn, Suspected esophageal reflux, Nausea with                            vomiting Medicines:                Monitored Anesthesia Care Procedure:                Pre-Anesthesia Assessment:                           - Prior to the procedure, a History and Physical                            was performed, and patient medications and                            allergies were reviewed. The patient's tolerance of                            previous anesthesia was also reviewed. The risks                            and benefits of the procedure and the sedation                            options and risks were discussed with the patient.                            All questions were answered, and informed consent                            was obtained. Prior Anticoagulants: The patient has                            taken no previous anticoagulant or antiplatelet                            agents. ASA Grade Assessment: II - A patient with                            mild systemic disease. After reviewing the risks                            and benefits, the patient was deemed in                            satisfactory condition to undergo the procedure.  After obtaining informed consent, the endoscope was                            passed under direct vision. Throughout the                            procedure, the patient's blood pressure, pulse, and                            oxygen saturations were monitored continuously. The                            GIF [id number] #[id number] was introduced through the                            mouth, and advanced to the second part of  duodenum.                            The upper GI endoscopy was accomplished without                            difficulty. The patient tolerated the procedure                            well. Scope In: Scope Out: Findings:                 LA Grade A (one or more mucosal breaks less than 5                            mm, not extending between tops of 2 mucosal folds)                            esophagitis with no bleeding was found in the lower                            third of the esophagus.                           The upper third of the esophagus and middle third                            of the esophagus were normal.                           The gastroesophageal flap valve was visualized                            endoscopically and classified as Hill Grade II                            (fold present, opens with respiration).                           Localized  mild inflammation characterized by                            erythema was found in the prepyloric region of the                            stomach. Biopsies were taken with a cold forceps                            for histology (jar #1). Estimated blood loss was                            minimal.                           The gastric fundus, gastric body and incisura were                            normal. Additional mucosal biopsies were taken with                            a cold forceps for Helicobacter pylori testing (jar                            #1). Estimated blood loss was minimal.                           The examined duodenum was normal. Biopsies were                            taken with a cold forceps for histology (jar #2).                            Estimated blood loss was minimal. Complications:            No immediate complications. Estimated Blood Loss:     Estimated blood loss was minimal. Impression:               - LA Grade A reflux esophagitis with no bleeding.                           -  Normal upper third of esophagus and middle third                            of esophagus.                           - Gastroesophageal flap valve classified as Hill                            Grade II (fold present, opens with respiration).                           - Gastritis. Biopsied.                           -  Normal gastric fundus, gastric body and incisura.                            Biopsied.                           - Normal examined duodenum. Biopsied. Recommendation:           - Patient has a contact number available for                            emergencies. The signs and symptoms of potential                            delayed complications were discussed with the                            patient. Return to normal activities tomorrow.                            Written discharge instructions were provided to the                            patient.                           - Resume previous diet.                           - Continue present medications.                           - Await pathology results.                           - Increase Prilosec (omeprazole) to 40 mg PO BID                            for 6 weeks to promote mucosal healing, then reduce                            to 40 mg daily and will continue to titrate to                            lowest effective dose to control reflux symtoms.                           - Return to GI clinic at appointment to be                            scheduled. Doristine Locks, MD 06/18/2021 8:28:13 AM

## 2021-06-18 NOTE — Patient Instructions (Signed)
YOU HAD AN ENDOSCOPIC PROCEDURE TODAY AT THE [facility]:   Refer to the procedure report that was given to you for any specific questions about what was found during the examination.  If the procedure report does not answer your questions, please call your gastroenterologist to clarify.  If you requested that your care partner not be given the details of your procedure findings, then the procedure report has been included in a sealed envelope for you to review at your convenience later.  YOU SHOULD EXPECT: Some feelings of bloating in the abdomen. Passage of more gas than usual.  Walking can help get rid of the air that was put into your GI tract during the procedure and reduce the bloating. If you had a lower endoscopy (such as a colonoscopy or flexible sigmoidoscopy) you may notice spotting of blood in your stool or on the toilet paper. If you underwent a bowel prep for your procedure, you may not have a normal bowel movement for a few days.  Please Note:  You might notice some irritation and congestion in your nose or some drainage.  This is from the oxygen used during your procedure.  There is no need for concern and it should clear up in a day or so.  SYMPTOMS TO REPORT IMMEDIATELY:   Following lower endoscopy (colonoscopy or flexible sigmoidoscopy):  Excessive amounts of blood in the stool  Significant tenderness or worsening of abdominal pains  Swelling of the abdomen that is new, acute  Fever of 100F or higher   Following upper endoscopy (EGD)  Vomiting of blood or coffee ground material  New chest pain or pain under the shoulder blades  Painful or persistently difficult swallowing  New shortness of breath  Fever of 100F or higher  Black, tarry-looking stools  For urgent or emergent issues, a gastroenterologist can be reached at any hour by calling [phone]. Do not use MyChart messaging for urgent concerns.    DIET:  We do recommend a small meal at first, but  then you may proceed to your regular diet.  Drink plenty of fluids but you should avoid alcoholic beverages for 24 hours.  ACTIVITY:  You should plan to take it easy for the rest of today and you should NOT DRIVE or use heavy machinery until tomorrow (because of the sedation medicines used during the test).    FOLLOW UP: Our staff will call the number listed on your records 48-72 hours following your procedure to check on you and address any questions or concerns that you may have regarding the information given to you following your procedure. If we do not reach you, we will leave a message.  We will attempt to reach you two times.  During this call, we will ask if you have developed any symptoms of COVID 19. If you develop any symptoms (ie: fever, flu-like symptoms, shortness of breath, cough etc.) before then, please call [phone].  If you test positive for Covid 19 in the 2 weeks post procedure, please call and report this information to us.    If any biopsies were taken you will be contacted by phone or by letter within the next 1-3 weeks.  Please call us at [phone] if you have not heard about the biopsies in 3 weeks.    SIGNATURES/CONFIDENTIALITY: You and/or your care partner have signed paperwork which will be entered into your electronic medical record.  These signatures attest to the fact that that the information above on   your After Visit Summary has been reviewed and is understood.  Full responsibility of the confidentiality of this discharge information lies with you and/or your care-partner. 

## 2021-06-18 NOTE — Progress Notes (Signed)
Pt's states no medical or surgical changes since previsit or office visit.  Vitals SB 

## 2021-06-18 NOTE — Progress Notes (Signed)
0810 Robinul 0.1 mg IV given due large amount of secretions upon assessment.  MD made aware, vss  

## 2021-06-18 NOTE — Progress Notes (Signed)
Report given to PACU, vss 

## 2021-06-21 ENCOUNTER — Telehealth: Payer: Self-pay

## 2021-06-21 NOTE — Telephone Encounter (Signed)
Per 06/18/21 procedure report - Return to GI clinic at appt to be scheduled  Secure staff message sent to schedulers to set up appt.

## 2021-06-22 ENCOUNTER — Telehealth: Payer: Self-pay | Admitting: *Deleted

## 2021-06-22 NOTE — Telephone Encounter (Signed)
Attempted f/u phone call. No answer. Left message. °

## 2021-06-22 NOTE — Telephone Encounter (Signed)
Attempted 2nd f/u phone call. No answer. Left message.  °

## 2021-06-29 ENCOUNTER — Encounter: Payer: Self-pay | Admitting: Gastroenterology

## 2021-07-21 ENCOUNTER — Telehealth (INDEPENDENT_AMBULATORY_CARE_PROVIDER_SITE_OTHER): Payer: 59 | Admitting: Family Medicine

## 2021-07-21 ENCOUNTER — Encounter: Payer: Self-pay | Admitting: Family Medicine

## 2021-07-21 DIAGNOSIS — R5383 Other fatigue: Secondary | ICD-10-CM

## 2021-07-21 DIAGNOSIS — K2970 Gastritis, unspecified, without bleeding: Secondary | ICD-10-CM | POA: Diagnosis not present

## 2021-07-21 DIAGNOSIS — R001 Bradycardia, unspecified: Secondary | ICD-10-CM

## 2021-07-21 DIAGNOSIS — K2990 Gastroduodenitis, unspecified, without bleeding: Secondary | ICD-10-CM

## 2021-07-21 DIAGNOSIS — R42 Dizziness and giddiness: Secondary | ICD-10-CM | POA: Diagnosis not present

## 2021-07-21 NOTE — Progress Notes (Signed)
Virtual Visit via Video Note  I connected with Tyler Best on 07/21/21 at 10:30 AM EDT by a video enabled telemedicine application 2/2 COVID-19 pandemic and verified that I am speaking with the correct person using two identifiers.  Location patient: At work at [employer] Location provider:work or home office Persons participating in the virtual visit: patient, provider  I discussed the limitations of evaluation and management by telemedicine and the availability of in person appointments. The patient expressed understanding and agreed to proceed.   HPI: Pt seen for follow-up on ongoing.  Recently seen by GI and Cards.  Bradycardia noted at Cards appointment which may contribute to dizziness.  Stress echo recommended.  Gastritis and gastroduodenitis noted on EGD with GI.  Taking Prilosec 40 mg daily.  Pt inquires if he needs to schedule follow-ups with specialists.  Notices some improvement in the nausea and dizziness first thing in am, but had a bad wk last wk due to diarrhea.  Taking the medications.  Not taking anxiety medication.   Pt denies ear pain.  Has noticed a pulsing sensation in ears, R>L.  Having less sweating at night.  Feels like the dizziness occurs then the nausea and vomiting.  No longer taking medication for anxiety as he feels it is less likely contributing to his symptoms.  ROS: See pertinent positives and negatives per HPI.  Past Medical History:  Diagnosis Date   Gout    Hyperlipidemia    Hypertriglyceridemia     Past Surgical History:  Procedure Laterality Date   APPENDECTOMY     UPPER GASTROINTESTINAL ENDOSCOPY  06/18/2021    Family History  Problem Relation Age of Onset   Diabetes Father    Cancer Maternal Grandfather    Colon cancer Neg Hx    Pancreatic cancer Neg Hx    Stomach cancer Neg Hx    Liver disease Neg Hx    Esophageal cancer Neg Hx      Current Outpatient Medications:    allopurinol (ZYLOPRIM) 100 MG tablet, Take 100 mg by  mouth daily., Disp: , Rfl:    colchicine 0.6 MG tablet, Take 1 tablet (0.6 mg total) by mouth 2 (two) times daily as needed. (Patient taking differently: Take 0.6 mg by mouth 2 (two) times daily as needed (gout).), Disp: 60 tablet, Rfl: 1   omeprazole (PRILOSEC) 40 MG capsule, Take 1 capsule (40 mg total) by mouth daily., Disp: 30 capsule, Rfl: 3   omeprazole (PRILOSEC) 40 MG capsule, Take 1 capsule (40 mg total) by mouth in the morning and at bedtime. Twice daily for 6 weeks and then once daily and titrate down, Disp: 90 capsule, Rfl: 3   ondansetron (ZOFRAN) 8 MG tablet, Take 1 tablet (8 mg total) by mouth every 6 (six) hours as needed for nausea or vomiting., Disp: 60 tablet, Rfl: 0   rosuvastatin (CRESTOR) 10 MG tablet, Take 1 tablet (10 mg total) by mouth daily., Disp: 30 tablet, Rfl: 2   sertraline (ZOLOFT) 50 MG tablet, Take 1 tablet (50 mg total) by mouth daily., Disp: 30 tablet, Rfl: 3   potassium chloride SA (KLOR-CON) 20 MEQ tablet, Take 1 tablet (20 mEq total) by mouth daily for 5 days., Disp: 5 tablet, Rfl: 0   predniSONE (DELTASONE) 10 MG tablet, Take 4 tabs every morning for 3 days, 3 tabs for 2 days, 2 tabs for 2 days, 1 tab for 1 day. (Patient not taking: No sig reported), Disp: 23 tablet, Rfl: 0  EXAM:  VITALS  per patient if applicable:  RR between 12-20 bpm  GENERAL: alert, oriented, appears well and in no acute distress  HEENT: atraumatic, conjunctiva clear, no obvious abnormalities on inspection of external nose and ears  NECK: normal movements of the head and neck  LUNGS: on inspection no signs of respiratory distress, breathing rate appears normal, no obvious gross SOB, gasping or wheezing  CV: no obvious cyanosis  MS: moves all visible extremities without noticeable abnormality  PSYCH/NEURO: pleasant and cooperative, no obvious depression or anxiety, speech and thought processing grossly intact  ASSESSMENT AND PLAN:  Discussed the following assessment and  plan:  Dizziness  -some improvement in symptoms -given dizziness prior to n/v, concern for middle ear issue -Continue follow-up with cardiology for echo - Plan: Ambulatory referral to ENT  Fatigue, unspecified type -Ongoing -Less likely 2/2 Crestor 10 mg was recently started -Lifestyle modifications including eating balanced diet, drinking plenty of water, limiting caffeine advised -Symptoms possibly 2/2 cardiac etiology.  Continue follow-up with cardiology for echo  Bradycardia -Low heart rate at baseline per patient -Given precautions -Continue follow-up with cardiology  Gastritis and gastroduodenitis -Continue Prilosec 40 mg and Zofran as needed for nausea -Patient advised to schedule follow-up appointment with GI -Avoid foods known to cause symptoms  Follow-up as needed  I discussed the assessment and treatment plan with the patient. The patient was provided an opportunity to ask questions and all were answered. The patient agreed with the plan and demonstrated an understanding of the instructions.   The patient was advised to call back or seek an in-person evaluation if the symptoms worsen or if the condition fails to improve as anticipated.  Deeann Saint, MD

## 2021-07-22 ENCOUNTER — Ambulatory Visit: Payer: 59 | Admitting: Cardiology

## 2021-07-31 ENCOUNTER — Other Ambulatory Visit: Payer: Self-pay | Admitting: Family Medicine

## 2021-07-31 DIAGNOSIS — K219 Gastro-esophageal reflux disease without esophagitis: Secondary | ICD-10-CM

## 2021-08-02 ENCOUNTER — Telehealth: Payer: Self-pay | Admitting: Family Medicine

## 2021-08-02 DIAGNOSIS — H60501 Unspecified acute noninfective otitis externa, right ear: Secondary | ICD-10-CM

## 2021-08-02 DIAGNOSIS — R42 Dizziness and giddiness: Secondary | ICD-10-CM

## 2021-08-02 NOTE — Telephone Encounter (Signed)
New referral placed due to the other referral was closed because Dr is retiring.

## 2021-08-02 NOTE — Telephone Encounter (Signed)
Patient stated that Dr.Banks referred him to the ear,nose, and throat doctor and he wanted to check up on that because he haven't heard anything back about scheduling an appointment.  Patient could be contacted at [phone].  Please advise.

## 2021-08-06 NOTE — Telephone Encounter (Signed)
New referral placed, per notes from Dr Ezzard Standing, for Dr Suszanne Conners of [facility].    Dr. Ezzard Standing is retiring in October and does not have the time to see this patient. He should be referred to [facility] of Dr. Suszanne Conners.

## 2021-08-06 NOTE — Addendum Note (Signed)
Addended by: Elwin Mocha on: 08/06/2021 02:31 PM   Modules accepted: Orders

## 2021-08-09 NOTE — Telephone Encounter (Signed)
Left vm informing pt new referral has been placed due to first referral placed the provider is retiring. Pt should be receiving a call to schedule an appointment.

## 2021-08-16 ENCOUNTER — Telehealth: Payer: Self-pay | Admitting: Family Medicine

## 2021-08-16 NOTE — Telephone Encounter (Signed)
Called pt, left vm informing him that Dr Ezzard Standing is retiring and all his patients, as well as all new patients are waiting for appointments. Also informed that he would need a visit for the recheck of cholesterol and to discuss liver check.

## 2021-08-16 NOTE — Telephone Encounter (Signed)
Patient needs a referral to a different provider for an ENT--the one he was referred to couldn't see him for 5 months.  Wants to have his cholesterol checked after taking medicine for 2-3 months  He also wants to have his liver checked.  Dad and brother has both suffered with their liver.    Patient is very frustrated about the ENT situation.  Please call patient back at home number.

## 2021-08-30 ENCOUNTER — Telehealth: Payer: Self-pay | Admitting: Family Medicine

## 2021-08-30 ENCOUNTER — Telehealth: Payer: 59 | Admitting: Physician Assistant

## 2021-08-30 DIAGNOSIS — M10072 Idiopathic gout, left ankle and foot: Secondary | ICD-10-CM | POA: Diagnosis not present

## 2021-08-30 MED ORDER — PREDNISONE 10 MG PO TABS: ORAL_TABLET | ORAL | 0 refills | Status: DC

## 2021-08-30 NOTE — Progress Notes (Signed)
Virtual Visit Consent   Tyler Best, you are scheduled for a virtual visit with a [facility] provider today.     Just as with appointments in the office, your consent must be obtained to participate.  Your consent will be active for this visit and any virtual visit you may have with one of our providers in the next 365 days.     If you have a MyChart account, a copy of this consent can be sent to you electronically.  All virtual visits are billed to your insurance company just like a traditional visit in the office.    As this is a virtual visit, video technology does not allow for your provider to perform a traditional examination.  This may limit your provider's ability to fully assess your condition.  If your provider identifies any concerns that need to be evaluated in person or the need to arrange testing (such as labs, EKG, etc.), we will make arrangements to do so.     Although advances in technology are sophisticated, we cannot ensure that it will always work on either your end or our end.  If the connection with a video visit is poor, the visit may have to be switched to a telephone visit.  With either a video or telephone visit, we are not always able to ensure that we have a secure connection.     I need to obtain your verbal consent now.   Are you willing to proceed with your visit today?    Elray Dains has provided verbal consent on 08/30/2021 for a virtual visit (video or telephone).   Margaretann Loveless, PA-C   Date: 08/30/2021 11:52 AM   Virtual Visit via Video Note   I, Margaretann Loveless, connected with  Tyler Best  ([id number], 1986-08-06) on 08/30/21 at 11:15 AM EDT by a video-enabled telemedicine application and verified that I am speaking with the correct person using two identifiers.  Location: Patient: Virtual Visit Location Patient: Home Provider: Virtual Visit Location Provider: Home Office   I discussed the limitations of  evaluation and management by telemedicine and the availability of in person appointments. The patient expressed understanding and agreed to proceed.    History of Present Illness: Tyler Best is a 35 y.o. who identifies as a male who was assigned male at birth, and is being seen today for gout. Flare started Friday evening. Left great toe is affected. Great toe is most commonly affected. Has had gout for 14 years. Takes allopurinol daily and history of colchicine.    Problems:  Patient Active Problem List   Diagnosis Date Noted   Hyperlipidemia 06/15/2021   Anxiety 05/03/2021   Gout 04/14/2020    Allergies: No Known Allergies Medications:  Current Outpatient Medications:    allopurinol (ZYLOPRIM) 100 MG tablet, Take 100 mg by mouth daily., Disp: , Rfl:    colchicine 0.6 MG tablet, Take 1 tablet (0.6 mg total) by mouth 2 (two) times daily as needed. (Patient taking differently: Take 0.6 mg by mouth 2 (two) times daily as needed (gout).), Disp: 60 tablet, Rfl: 1   omeprazole (PRILOSEC) 40 MG capsule, Take 1 capsule (40 mg total) by mouth daily., Disp: 30 capsule, Rfl: 3   omeprazole (PRILOSEC) 40 MG capsule, Take 1 capsule (40 mg total) by mouth in the morning and at bedtime. Twice daily for 6 weeks and then once daily and titrate down, Disp: 90 capsule, Rfl: 3   ondansetron (ZOFRAN) 8  MG tablet, Take 1 tablet (8 mg total) by mouth every 6 (six) hours as needed for nausea or vomiting., Disp: 60 tablet, Rfl: 0   potassium chloride SA (KLOR-CON) 20 MEQ tablet, Take 1 tablet (20 mEq total) by mouth daily for 5 days., Disp: 5 tablet, Rfl: 0   predniSONE (DELTASONE) 10 MG tablet, Take 4 tabs every morning for 3 days, 3 tabs for 2 days, 2 tabs for 2 days, 1 tab for 1 day., Disp: 23 tablet, Rfl: 0   rosuvastatin (CRESTOR) 10 MG tablet, Take 1 tablet (10 mg total) by mouth daily., Disp: 30 tablet, Rfl: 2  Observations/Objective: Patient is well-developed, well-nourished in no acute distress.   Resting comfortably at home.  Head is normocephalic, atraumatic.  No labored breathing.  Speech is clear and coherent with logical content.  Patient is alert and oriented at baseline.    Assessment and Plan: 1. Acute idiopathic gout involving toe of left foot - predniSONE (DELTASONE) 10 MG tablet; Take 4 tabs every morning for 3 days, 3 tabs for 2 days, 2 tabs for 2 days, 1 tab for 1 day.  Dispense: 23 tablet; Refill: 0  - Acute on chronic issue - Prednisone prescribed as above - Push fluids - Seek in person evaluation if symptoms persist or worsen  Follow Up Instructions: I discussed the assessment and treatment plan with the patient. The patient was provided an opportunity to ask questions and all were answered. The patient agreed with the plan and demonstrated an understanding of the instructions.  A copy of instructions were sent to the patient via MyChart unless otherwise noted below.    The patient was advised to call back or seek an in-person evaluation if the symptoms worsen or if the condition fails to improve as anticipated.  Time:  I spent 8 minutes with the patient via telehealth technology discussing the above problems/concerns.    Margaretann Loveless, PA-C

## 2021-08-30 NOTE — Patient Instructions (Signed)
Tyler Best, thank you for joining Margaretann Loveless, PA-C for today's virtual visit.  While this provider is not your primary care provider (PCP), if your PCP is located in our provider database this encounter information will be shared with them immediately following your visit.  Consent: (Patient) Tyler Best provided verbal consent for this virtual visit at the beginning of the encounter.  Current Medications:  Current Outpatient Medications:    allopurinol (ZYLOPRIM) 100 MG tablet, Take 100 mg by mouth daily., Disp: , Rfl:    colchicine 0.6 MG tablet, Take 1 tablet (0.6 mg total) by mouth 2 (two) times daily as needed. (Patient taking differently: Take 0.6 mg by mouth 2 (two) times daily as needed (gout).), Disp: 60 tablet, Rfl: 1   omeprazole (PRILOSEC) 40 MG capsule, Take 1 capsule (40 mg total) by mouth daily., Disp: 30 capsule, Rfl: 3   omeprazole (PRILOSEC) 40 MG capsule, Take 1 capsule (40 mg total) by mouth in the morning and at bedtime. Twice daily for 6 weeks and then once daily and titrate down, Disp: 90 capsule, Rfl: 3   ondansetron (ZOFRAN) 8 MG tablet, Take 1 tablet (8 mg total) by mouth every 6 (six) hours as needed for nausea or vomiting., Disp: 60 tablet, Rfl: 0   potassium chloride SA (KLOR-CON) 20 MEQ tablet, Take 1 tablet (20 mEq total) by mouth daily for 5 days., Disp: 5 tablet, Rfl: 0   predniSONE (DELTASONE) 10 MG tablet, Take 4 tabs every morning for 3 days, 3 tabs for 2 days, 2 tabs for 2 days, 1 tab for 1 day., Disp: 23 tablet, Rfl: 0   rosuvastatin (CRESTOR) 10 MG tablet, Take 1 tablet (10 mg total) by mouth daily., Disp: 30 tablet, Rfl: 2   Medications ordered in this encounter:  Meds ordered this encounter  Medications   predniSONE (DELTASONE) 10 MG tablet    Sig: Take 4 tabs every morning for 3 days, 3 tabs for 2 days, 2 tabs for 2 days, 1 tab for 1 day.    Dispense:  23 tablet    Refill:  0    Order Specific Question:   Supervising  Provider    Answer:   Hyacinth Meeker, BRIAN [[id number]]     *If you need refills on other medications prior to your next appointment, please contact your pharmacy*  Follow-Up: Call back or seek an in-person evaluation if the symptoms worsen or if the condition fails to improve as anticipated.  Other Instructions Gout Gout is a condition that causes painful swelling of the joints. Gout is a type of inflammation of the joints (arthritis). This condition is caused by having too much uric acid in the body. Uric acid is a chemical that forms when the body breaks down substances called purines. Purines are important for building body proteins. When the body has too much uric acid, sharp crystals can form and build up inside the joints. This causes pain and swelling. Gout attacks can happen quickly and may be very painful (acute gout). Over time, the attacks can affect more joints and become more frequent (chronic gout). Gout can also cause uric acid to build up under the skin and inside the kidneys. What are the causes? This condition is caused by too much uric acid in your blood. This can happen because: Your kidneys do not remove enough uric acid from your blood. This is the most common cause. Your body makes too much uric acid. This can happen with some cancers  and cancer treatments. It can also occur if your body is breaking down too many red blood cells (hemolytic anemia). You eat too many foods that are high in purines. These foods include organ meats and some seafood. Alcohol, especially beer, is also high in purines. A gout attack may be triggered by trauma or stress. What increases the risk? You are more likely to develop this condition if you: Have a family history of gout. Are male and middle-aged. Are male and have gone through menopause. Are obese. Frequently drink alcohol, especially beer. Are dehydrated. Lose weight too quickly. Have an organ transplant. Have lead poisoning. Take certain  medicines, including aspirin, cyclosporine, diuretics, levodopa, and niacin. Have kidney disease. Have a skin condition called psoriasis. What are the signs or symptoms? An attack of acute gout happens quickly. It usually occurs in just one joint. The most common place is the big toe. Attacks often start at night. Other joints that may be affected include joints of the feet, ankle, knee, fingers, wrist, or elbow. Symptoms of this condition may include: Severe pain. Warmth. Swelling. Stiffness. Tenderness. The affected joint may be very painful to touch. Shiny, red, or purple skin. Chills and fever. Chronic gout may cause symptoms more frequently. More joints may be involved. You may also have white or yellow lumps (tophi) on your hands or feet or in other areas near your joints. How is this diagnosed? This condition is diagnosed based on your symptoms, medical history, and physical exam. You may have tests, such as: Blood tests to measure uric acid levels. Removal of joint fluid with a thin needle (aspiration) to look for uric acid crystals. X-rays to look for joint damage. How is this treated? Treatment for this condition has two phases: treating an acute attack and preventing future attacks. Acute gout treatment may include medicines to reduce pain and swelling, including: NSAIDs. Steroids. These are strong anti-inflammatory medicines that can be taken by mouth (orally) or injected into a joint. Colchicine. This medicine relieves pain and swelling when it is taken soon after an attack. It can be given by mouth or through an IV. Preventive treatment may include: Daily use of smaller doses of NSAIDs or colchicine. Use of a medicine that reduces uric acid levels in your blood. Changes to your diet. You may need to see a dietitian about what to eat and drink to prevent gout. Follow these instructions at home: During a gout attack  If directed, put ice on the affected area: Put ice in a  plastic bag. Place a towel between your skin and the bag. Leave the ice on for 20 minutes, 2-3 times a day. Raise (elevate) the affected joint above the level of your heart as often as possible. Rest the joint as much as possible. If the affected joint is in your leg, you may be given crutches to use. Follow instructions from your health care provider about eating or drinking restrictions. Avoiding future gout attacks Follow a low-purine diet as told by your dietitian or health care provider. Avoid foods and drinks that are high in purines, including liver, kidney, anchovies, asparagus, herring, mushrooms, mussels, and beer. Maintain a healthy weight or lose weight if you are overweight. If you want to lose weight, talk with your health care provider. It is important that you do not lose weight too quickly. Start or maintain an exercise program as told by your health care provider. Eating and drinking Drink enough fluids to keep your urine pale yellow. If  you drink alcohol: Limit how much you use to: 0-1 drink a day for women. 0-2 drinks a day for men. Be aware of how much alcohol is in your drink. In the [place] one drink equals one 12 oz bottle of beer (355 mL) one 5 oz glass of wine (148 mL), or one 1 oz glass of hard liquor (44 mL). General instructions Take over-the-counter and prescription medicines only as told by your health care provider. Do not drive or use heavy machinery while taking prescription pain medicine. Return to your normal activities as told by your health care provider. Ask your health care provider what activities are safe for you. Keep all follow-up visits as told by your health care provider. This is important. Contact a health care provider if you have: Another gout attack. Continuing symptoms of a gout attack after 10 days of treatment. Side effects from your medicines. Chills or a fever. Burning pain when you urinate. Pain in your lower back or belly. Get help  right away if you: Have severe or uncontrolled pain. Cannot urinate. Summary Gout is painful swelling of the joints caused by inflammation. The most common site of pain is the big toe, but it can affect other joints in the body. Medicines and dietary changes can help to prevent and treat gout attacks. This information is not intended to replace advice given to you by your health care provider. Make sure you discuss any questions you have with your health care provider. Document Revised: 05/23/2018 Document Reviewed: 05/23/2018 Elsevier Patient Education  2022 [employer].    If you have been instructed to have an in-person evaluation today at a local Urgent Care facility, please use the link below. It will take you to a list of all of our available [facility], including address, phone number and hours of operation. Please do not delay care.  [facility]  If you or a family member do not have a primary care provider, use the link below to schedule a visit and establish care. When you choose a [facility] primary care physician or advanced practice provider, you gain a long-term partner in health. Find a Primary Care Provider  Learn more about [facility]'s in-office and virtual care options: [facility] - Get Care Now

## 2021-08-30 NOTE — Telephone Encounter (Signed)
Patient called to get refill on predniSONE (DELTASONE) 10 MG tablet because Gout is flaring up. Patient states foot hurts very badly and is hindering him taking care of his kids. Would like it ASAP. Patient would like a call back when prescription is sent so he can pick it up. Would also like orders for lab work to test cholesterol.     Please Send to  [id number] [place], [place] - [address] AT [facility] [address] Phone:  [phone]  Fax:  [phone]         [facility] callback number is [phone]    Please Advise

## 2021-08-30 NOTE — Telephone Encounter (Signed)
Pt is calling and will make an appt to come in for blood work however he is having trouble walking and would like prednisone to be call in today. Pt states md know he has history of gout

## 2021-09-07 ENCOUNTER — Telehealth: Payer: 59 | Admitting: Physician Assistant

## 2021-09-07 DIAGNOSIS — M10079 Idiopathic gout, unspecified ankle and foot: Secondary | ICD-10-CM

## 2021-09-07 MED ORDER — PREDNISONE 10 MG PO TABS: ORAL_TABLET | ORAL | 0 refills | Status: DC

## 2021-09-07 MED ORDER — PREDNISONE 20 MG PO TABS: 40.0000 mg | ORAL_TABLET | Freq: Every day | ORAL | 0 refills | Status: AC

## 2021-09-07 NOTE — Progress Notes (Signed)
Virtual Visit Consent   Tyler Best, you are scheduled for a virtual visit with a [facility] provider today.     Just as with appointments in the office, your consent must be obtained to participate.  Your consent will be active for this visit and any virtual visit you may have with one of our providers in the next 365 days.     If you have a MyChart account, a copy of this consent can be sent to you electronically.  All virtual visits are billed to your insurance company just like a traditional visit in the office.    As this is a virtual visit, video technology does not allow for your provider to perform a traditional examination.  This may limit your provider's ability to fully assess your condition.  If your provider identifies any concerns that need to be evaluated in person or the need to arrange testing (such as labs, EKG, etc.), we will make arrangements to do so.     Although advances in technology are sophisticated, we cannot ensure that it will always work on either your end or our end.  If the connection with a video visit is poor, the visit may have to be switched to a telephone visit.  With either a video or telephone visit, we are not always able to ensure that we have a secure connection.     I need to obtain your verbal consent now.   Are you willing to proceed with your visit today?    Tyler Best has provided verbal consent on 09/07/2021 for a virtual visit (video or telephone).   Tyler Best, [place]   Date: 09/07/2021 8:47 AM   Virtual Visit via Video Note   I, Tyler Best, connected with  Tyler Best  ([id number], 20-Nov-1985) on 09/07/21 at  8:30 AM EDT by a video-enabled telemedicine application and verified that I am speaking with the correct person using two identifiers.  Location: Patient: Virtual Visit Location Patient: Home Provider: Virtual Visit Location Provider: Home Office   I discussed the limitations of  evaluation and management by telemedicine and the availability of in person appointments. The patient expressed understanding and agreed to proceed.    History of Present Illness: Tyler Best is a 35 y.o. who identifies as a male who was assigned male at birth, and is being seen today for recurrence of gout flare of L great toe. Was initially seen about a week ago for this issue at which time short prednisone taper was prescribed. Noted substantial improvement the first couple of days but as dose was quickly lowered began to have flares of symptoms. Denies fever, chills, aches. Denies any trauma or injury to tow or foot. Notes normal ROM of joint but with pain. Denies any surrounding redness, warmth or tenderness of skin. Is not on his allopurinol at present. Has also been recently dealing with substantial GERD -- on PPI and followed by Gastroenterology.   HPI: HPI  Problems:  Patient Active Problem List   Diagnosis Date Noted   Hyperlipidemia 06/15/2021   Anxiety 05/03/2021   Gout 04/14/2020    Allergies: No Known Allergies Medications:  Current Outpatient Medications:    predniSONE (DELTASONE) 10 MG tablet, To start after 40 mg dose (20 mg tablets) complete. Take 30 mg PO QD x 4 days, then 20 mg QD x 3 days, then 10 mg PO x 3 days., Disp: 21 tablet, Rfl: 0   predniSONE (DELTASONE) 20  MG tablet, Take 2 tablets (40 mg total) by mouth daily with breakfast for 5 days., Disp: 10 tablet, Rfl: 0   allopurinol (ZYLOPRIM) 100 MG tablet, Take 100 mg by mouth daily., Disp: , Rfl:    colchicine 0.6 MG tablet, Take 1 tablet (0.6 mg total) by mouth 2 (two) times daily as needed. (Patient taking differently: Take 0.6 mg by mouth 2 (two) times daily as needed (gout).), Disp: 60 tablet, Rfl: 1   omeprazole (PRILOSEC) 40 MG capsule, Take 1 capsule (40 mg total) by mouth daily., Disp: 30 capsule, Rfl: 3   omeprazole (PRILOSEC) 40 MG capsule, Take 1 capsule (40 mg total) by mouth in the morning and at  bedtime. Twice daily for 6 weeks and then once daily and titrate down, Disp: 90 capsule, Rfl: 3   ondansetron (ZOFRAN) 8 MG tablet, Take 1 tablet (8 mg total) by mouth every 6 (six) hours as needed for nausea or vomiting., Disp: 60 tablet, Rfl: 0   potassium chloride SA (KLOR-CON) 20 MEQ tablet, Take 1 tablet (20 mEq total) by mouth daily for 5 days., Disp: 5 tablet, Rfl: 0   rosuvastatin (CRESTOR) 10 MG tablet, Take 1 tablet (10 mg total) by mouth daily., Disp: 30 tablet, Rfl: 2  Observations/Objective: Patient is well-developed, well-nourished in no acute distress.  Resting comfortably at home.  Head is normocephalic, atraumatic.  No labored breathing. Speech is clear and coherent with logical content.  Patient is alert and oriented at baseline.   Assessment and Plan: 1. Acute idiopathic gout of foot, unspecified laterality - predniSONE (DELTASONE) 20 MG tablet; Take 2 tablets (40 mg total) by mouth daily with breakfast for 5 days.  Dispense: 10 tablet; Refill: 0 - predniSONE (DELTASONE) 10 MG tablet; To start after 40 mg dose (20 mg tablets) complete. Take 30 mg PO QD x 4 days, then 20 mg QD x 3 days, then 10 mg PO x 3 days.  Dispense: 21 tablet; Refill: 0 Suspect rebound flare due to quick taper of steroid. Trying to avoid NSAIDs giving his GI history. Will restart prednisone at 40 mg daily x 4 days, then taper to 30 mg x 3 days, 20 x 3 days and 10 x 3 days. He needs follow-up with PCP to discuss restarting Allopurinol for prevention especially giving usually restarted with temporary colchicine and giving recent GI issues. If not resolving or anything new or worsening, he needs in-person assessment ASAP.  Follow Up Instructions: I discussed the assessment and treatment plan with the patient. The patient was provided an opportunity to ask questions and all were answered. The patient agreed with the plan and demonstrated an understanding of the instructions.  A copy of instructions were sent to  the patient via MyChart unless otherwise noted below.   The patient was advised to call back or seek an in-person evaluation if the symptoms worsen or if the condition fails to improve as anticipated.  Time:  I spent 12 minutes with the patient via telehealth technology discussing the above problems/concerns.    Tyler Climes, PA-C

## 2021-09-07 NOTE — Patient Instructions (Signed)
  Tyler Best, thank you for joining [name], PA-C for today's virtual visit.  While this provider is not your primary care provider (PCP), if your PCP is located in our provider database this encounter information will be shared with them immediately following your visit.  Consent: (Patient) Tyler Best provided verbal consent for this virtual visit at the beginning of the encounter.  Current Medications:  Current Outpatient Medications:    allopurinol (ZYLOPRIM) 100 MG tablet, Take 100 mg by mouth daily., Disp: , Rfl:    colchicine 0.6 MG tablet, Take 1 tablet (0.6 mg total) by mouth 2 (two) times daily as needed. (Patient taking differently: Take 0.6 mg by mouth 2 (two) times daily as needed (gout).), Disp: 60 tablet, Rfl: 1   omeprazole (PRILOSEC) 40 MG capsule, Take 1 capsule (40 mg total) by mouth daily., Disp: 30 capsule, Rfl: 3   omeprazole (PRILOSEC) 40 MG capsule, Take 1 capsule (40 mg total) by mouth in the morning and at bedtime. Twice daily for 6 weeks and then once daily and titrate down, Disp: 90 capsule, Rfl: 3   ondansetron (ZOFRAN) 8 MG tablet, Take 1 tablet (8 mg total) by mouth every 6 (six) hours as needed for nausea or vomiting., Disp: 60 tablet, Rfl: 0   potassium chloride SA (KLOR-CON) 20 MEQ tablet, Take 1 tablet (20 mEq total) by mouth daily for 5 days., Disp: 5 tablet, Rfl: 0   predniSONE (DELTASONE) 10 MG tablet, Take 4 tabs every morning for 3 days, 3 tabs for 2 days, 2 tabs for 2 days, 1 tab for 1 day., Disp: 23 tablet, Rfl: 0   rosuvastatin (CRESTOR) 10 MG tablet, Take 1 tablet (10 mg total) by mouth daily., Disp: 30 tablet, Rfl: 2   Medications ordered in this encounter:  No orders of the defined types were placed in this encounter.    *If you need refills on other medications prior to your next appointment, please contact your pharmacy*  Follow-Up: Call back or seek an in-person evaluation if the symptoms worsen or if the  condition fails to improve as anticipated.  Other Instructions Please take the new steroid prescriptions as directed starting with 40 mg dose for 5 days before starting the taper.  Tylenol for breakthrough pain. Keep hydrated. You need to schedule a follow-up with your PCP to discuss restarting preventive medication after flare. If anything is not resolving, or any new or worsening symptoms, you need in-person evaluation ASAP.   If you have been instructed to have an in-person evaluation today at a local Urgent Care facility, please use the link below. It will take you to a list of all of our available [facility], including address, phone number and hours of operation. Please do not delay care.  [facility]  If you or a family member do not have a primary care provider, use the link below to schedule a visit and establish care. When you choose a [facility] primary care physician or advanced practice provider, you gain a long-term partner in health. Find a Primary Care Provider  Learn more about [facility]'s in-office and virtual care options: [facility] - Get Care Now

## 2021-09-27 ENCOUNTER — Encounter: Payer: Self-pay | Admitting: Family Medicine

## 2021-09-27 ENCOUNTER — Telehealth: Payer: 59 | Admitting: Family Medicine

## 2021-09-27 ENCOUNTER — Encounter: Payer: 59 | Admitting: Physician Assistant

## 2021-09-27 DIAGNOSIS — M109 Gout, unspecified: Secondary | ICD-10-CM

## 2021-09-27 MED ORDER — PREDNISONE 20 MG PO TABS
ORAL_TABLET | ORAL | 0 refills | Status: DC
Start: 2021-09-27 — End: 2021-10-27

## 2021-09-27 NOTE — Progress Notes (Signed)
Duplicate

## 2021-09-27 NOTE — Progress Notes (Signed)
Virtual Visit Consent   Tyler Best, you are scheduled for a virtual visit with a [facility] provider today.     Just as with appointments in the office, your consent must be obtained to participate.  Your consent will be active for this visit and any virtual visit you may have with one of our providers in the next 365 days.     If you have a MyChart account, a copy of this consent can be sent to you electronically.  All virtual visits are billed to your insurance company just like a traditional visit in the office.    As this is a virtual visit, video technology does not allow for your provider to perform a traditional examination.  This may limit your provider's ability to fully assess your condition.  If your provider identifies any concerns that need to be evaluated in person or the need to arrange testing (such as labs, EKG, etc.), we will make arrangements to do so.     Although advances in technology are sophisticated, we cannot ensure that it will always work on either your end or our end.  If the connection with a video visit is poor, the visit may have to be switched to a telephone visit.  With either a video or telephone visit, we are not always able to ensure that we have a secure connection.     I need to obtain your verbal consent now.   Are you willing to proceed with your visit today?    Tyler Best has provided verbal consent on 09/27/2021 for a virtual visit (video or telephone).   [name], NP   Date: 09/27/2021 8:16 AM   Virtual Visit via Video Note   I, [name], connected with  Tyler Best  ([id number], 09-26-1986) on 09/27/21 at  8:15 AM EST by a video-enabled telemedicine application and verified that I am speaking with the correct person using two identifiers.  Location:  Patient: Virtual Visit Location Patient: Home Provider: Virtual Visit Location Provider: Home Office   I discussed the limitations of evaluation and  management by telemedicine and the availability of in person appointments. The patient expressed understanding and agreed to proceed.    History of Present Illness: Tyler Best is a 35 y.o. who identifies as a male who was assigned male at birth, and is being seen today for recurrence of gout flare of L great toe. Was initially seen about a month back, and one week after that. He received and completed a short prednisone taper. At that time he noted limited improvement the first couple of days but as dose was quickly lowered began to have flares of symptoms- with the first round of prednisone. He was repeated on some- this time it lasted a bit longer but he presents as if it just did not help much. 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 He reports hx of this gout for 10 plus years. Is taking allopurinol and colchicine but does not feel it is helping much.   Denies fever, chills, aches. Denies any trauma or injury to tow or foot. Notes normal ROM of joint but with pain. Denies any surrounding redness, warmth or tenderness of skin.    Problems:  Patient Active Problem List   Diagnosis Date Noted   Hyperlipidemia 06/15/2021   Anxiety 05/03/2021  Gout 04/14/2020    Allergies: No Known Allergies Medications:  Current Outpatient Medications:    allopurinol (ZYLOPRIM) 100 MG tablet, Take 100 mg by mouth daily., Disp: , Rfl:    colchicine 0.6 MG tablet, Take 1 tablet (0.6 mg total) by mouth 2 (two) times daily as needed. (Patient taking differently: Take 0.6 mg by mouth 2 (two) times daily as needed (gout).), Disp: 60 tablet, Rfl: 1   omeprazole (PRILOSEC) 40 MG capsule, Take 1 capsule (40 mg total) by mouth daily., Disp: 30 capsule, Rfl: 3   omeprazole (PRILOSEC) 40 MG capsule, Take 1 capsule (40 mg total) by mouth in the morning and at bedtime. Twice daily for 6 weeks and then once daily and titrate  down, Disp: 90 capsule, Rfl: 3   ondansetron (ZOFRAN) 8 MG tablet, Take 1 tablet (8 mg total) by mouth every 6 (six) hours as needed for nausea or vomiting., Disp: 60 tablet, Rfl: 0   potassium chloride SA (KLOR-CON) 20 MEQ tablet, Take 1 tablet (20 mEq total) by mouth daily for 5 days., Disp: 5 tablet, Rfl: 0   predniSONE (DELTASONE) 10 MG tablet, To start after 40 mg dose (20 mg tablets) complete. Take 30 mg PO QD x 4 days, then 20 mg QD x 3 days, then 10 mg PO x 3 days., Disp: 21 tablet, Rfl: 0   rosuvastatin (CRESTOR) 10 MG tablet, Take 1 tablet (10 mg total) by mouth daily., Disp: 30 tablet, Rfl: 2  Observations/Objective: Patient is well-developed, well-nourished in no acute distress.  Resting comfortably  at home.  Head is normocephalic, atraumatic.  No labored breathing.  Speech is clear and coherent with logical content.  Patient is alert and oriented at baseline.  Great left toe is red at first metatarsophalangeal joint  Assessment and Plan:  1. Acute gout involving toe of left foot, unspecified cause Concern for worsening inflammatory process- stressed this is the last time he would be able to be seen in the virtual clinic for this at this time. He understands, will make appt at [facility] on Wed.  Would benefit from f/u with PCP to continue or increase dose of allopurinol.    Reviewed side effects, risks and benefits of medication.    Patient acknowledged agreement and understanding of the plan.   I discussed the assessment and treatment plan with the patient. The patient was provided an opportunity to ask questions and all were answered. The patient agreed with the plan and demonstrated an understanding of the instructions.   The patient was advised to call back or seek an in-person evaluation if the symptoms worsen or if the condition fails to improve as anticipated.   The above assessment and management plan was discussed with the patient. The patient verbalized understanding of  and has agreed to the management plan. Patient is aware to call the clinic if symptoms persist or worsen. Patient is aware when to return to the clinic for a follow-up visit. Patient educated on when it is appropriate to go to the emergency department.   - predniSONE (DELTASONE) 20 MG tablet; Take 40 mg daily by mouth for two days, Take 20 mg daily by mouth for one day  Dispense: 5 tablet; Refill: 0   Follow Up Instructions: I discussed the assessment and treatment plan with the patient. The patient was provided an opportunity to ask questions and all were answered. The patient agreed with the plan and demonstrated an understanding of the instructions.  A copy of instructions were  sent to the patient via MyChart unless otherwise noted below.     The patient was advised to call back or seek an in-person evaluation if the symptoms worsen or if the condition fails to improve as anticipated.  Time:  I spent 10 minutes with the patient via telehealth technology discussing the above problems/concerns.    [name], NP

## 2021-09-27 NOTE — Patient Instructions (Signed)
Please go be seen by Urgent Care on Wednesday  And go ahead and make a follow up appt with your PCP to have your medications reviewed and adjusted   We hope you feel better.  With gratitude  Dahlia Client

## 2021-10-06 ENCOUNTER — Other Ambulatory Visit: Payer: Self-pay | Admitting: Cardiology

## 2021-10-27 ENCOUNTER — Telehealth: Payer: 59 | Admitting: Physician Assistant

## 2021-10-27 DIAGNOSIS — M10071 Idiopathic gout, right ankle and foot: Secondary | ICD-10-CM | POA: Diagnosis not present

## 2021-10-27 MED ORDER — PREDNISONE 20 MG PO TABS: ORAL_TABLET | ORAL | 0 refills | Status: DC

## 2021-10-27 NOTE — Patient Instructions (Signed)
Tyler Best, thank you for joining Margaretann Loveless, PA-C for today's virtual visit.  While this provider is not your primary care provider (PCP), if your PCP is located in our provider database this encounter information will be shared with them immediately following your visit.  Consent: (Patient) Tyler Best provided verbal consent for this virtual visit at the beginning of the encounter.  Current Medications:  Current Outpatient Medications:    predniSONE (DELTASONE) 20 MG tablet, Take 40 mg (2 tablets) PO x 3 days then 20mg  (1 tablet) PO x 4 days, Disp: 10 tablet, Rfl: 0   allopurinol (ZYLOPRIM) 100 MG tablet, Take 100 mg by mouth daily., Disp: , Rfl:    colchicine 0.6 MG tablet, Take 1 tablet (0.6 mg total) by mouth 2 (two) times daily as needed. (Patient taking differently: Take 0.6 mg by mouth 2 (two) times daily as needed (gout).), Disp: 60 tablet, Rfl: 1   omeprazole (PRILOSEC) 40 MG capsule, Take 1 capsule (40 mg total) by mouth daily., Disp: 30 capsule, Rfl: 3   omeprazole (PRILOSEC) 40 MG capsule, Take 1 capsule (40 mg total) by mouth in the morning and at bedtime. Twice daily for 6 weeks and then once daily and titrate down, Disp: 90 capsule, Rfl: 3   ondansetron (ZOFRAN) 8 MG tablet, Take 1 tablet (8 mg total) by mouth every 6 (six) hours as needed for nausea or vomiting., Disp: 60 tablet, Rfl: 0   potassium chloride SA (KLOR-CON) 20 MEQ tablet, Take 1 tablet (20 mEq total) by mouth daily for 5 days., Disp: 5 tablet, Rfl: 0   rosuvastatin (CRESTOR) 10 MG tablet, TAKE 1 TABLET(10 MG) BY MOUTH DAILY, Disp: 30 tablet, Rfl: 2   Medications ordered in this encounter:  Meds ordered this encounter  Medications   predniSONE (DELTASONE) 20 MG tablet    Sig: Take 40 mg (2 tablets) PO x 3 days then 20mg  (1 tablet) PO x 4 days    Dispense:  10 tablet    Refill:  0    Order Specific Question:   Supervising Provider    Answer:   [[id number]]     *If you  need refills on other medications prior to your next appointment, please contact your pharmacy*  Follow-Up: Call back or seek an in-person evaluation if the symptoms worsen or if the condition fails to improve as anticipated.  Other Instructions Gout Gout is a condition that causes painful swelling of the joints. Gout is a type of inflammation of the joints (arthritis). This condition is caused by having too much uric acid in the body. Uric acid is a chemical that forms when the body breaks down substances called purines. Purines are important for building body proteins. When the body has too much uric acid, sharp crystals can form and build up inside the joints. This causes pain and swelling. Gout attacks can happen quickly and may be very painful (acute gout). Over time, the attacks can affect more joints and become more frequent (chronic gout). Gout can also cause uric acid to build up under the skin and inside the kidneys. What are the causes? This condition is caused by too much uric acid in your blood. This can happen because: Your kidneys do not remove enough uric acid from your blood. This is the most common cause. Your body makes too much uric acid. This can happen with some cancers and cancer treatments. It can also occur if your body is breaking down too  many red blood cells (hemolytic anemia). You eat too many foods that are high in purines. These foods include organ meats and some seafood. Alcohol, especially beer, is also high in purines. A gout attack may be triggered by trauma or stress. What increases the risk? You are more likely to develop this condition if you: Have a family history of gout. Are male and middle-aged. Are male and have gone through menopause. Are obese. Frequently drink alcohol, especially beer. Are dehydrated. Lose weight too quickly. Have an organ transplant. Have lead poisoning. Take certain medicines, including aspirin, cyclosporine, diuretics,  levodopa, and niacin. Have kidney disease. Have a skin condition called psoriasis. What are the signs or symptoms? An attack of acute gout happens quickly. It usually occurs in just one joint. The most common place is the big toe. Attacks often start at night. Other joints that may be affected include joints of the feet, ankle, knee, fingers, wrist, or elbow. Symptoms of this condition may include: Severe pain. Warmth. Swelling. Stiffness. Tenderness. The affected joint may be very painful to touch. Shiny, red, or purple skin. Chills and fever. Chronic gout may cause symptoms more frequently. More joints may be involved. You may also have white or yellow lumps (tophi) on your hands or feet or in other areas near your joints. How is this diagnosed? This condition is diagnosed based on your symptoms, medical history, and physical exam. You may have tests, such as: Blood tests to measure uric acid levels. Removal of joint fluid with a thin needle (aspiration) to look for uric acid crystals. X-rays to look for joint damage. How is this treated? Treatment for this condition has two phases: treating an acute attack and preventing future attacks. Acute gout treatment may include medicines to reduce pain and swelling, including: NSAIDs. Steroids. These are strong anti-inflammatory medicines that can be taken by mouth (orally) or injected into a joint. Colchicine. This medicine relieves pain and swelling when it is taken soon after an attack. It can be given by mouth or through an IV. Preventive treatment may include: Daily use of smaller doses of NSAIDs or colchicine. Use of a medicine that reduces uric acid levels in your blood. Changes to your diet. You may need to see a dietitian about what to eat and drink to prevent gout. Follow these instructions at home: During a gout attack  If directed, put ice on the affected area: Put ice in a plastic bag. Place a towel between your skin and the  bag. Leave the ice on for 20 minutes, 2-3 times a day. Raise (elevate) the affected joint above the level of your heart as often as possible. Rest the joint as much as possible. If the affected joint is in your leg, you may be given crutches to use. Follow instructions from your health care provider about eating or drinking restrictions. Avoiding future gout attacks Follow a low-purine diet as told by your dietitian or health care provider. Avoid foods and drinks that are high in purines, including liver, kidney, anchovies, asparagus, herring, mushrooms, mussels, and beer. Maintain a healthy weight or lose weight if you are overweight. If you want to lose weight, talk with your health care provider. It is important that you do not lose weight too quickly. Start or maintain an exercise program as told by your health care provider. Eating and drinking Drink enough fluids to keep your urine pale yellow. If you drink alcohol: Limit how much you use to: 0-1 drink a day for  women. 0-2 drinks a day for men. Be aware of how much alcohol is in your drink. In the [place] one drink equals one 12 oz bottle of beer (355 mL) one 5 oz glass of wine (148 mL), or one 1 oz glass of hard liquor (44 mL). General instructions Take over-the-counter and prescription medicines only as told by your health care provider. Do not drive or use heavy machinery while taking prescription pain medicine. Return to your normal activities as told by your health care provider. Ask your health care provider what activities are safe for you. Keep all follow-up visits as told by your health care provider. This is important. Contact a health care provider if you have: Another gout attack. Continuing symptoms of a gout attack after 10 days of treatment. Side effects from your medicines. Chills or a fever. Burning pain when you urinate. Pain in your lower back or belly. Get help right away if you: Have severe or uncontrolled  pain. Cannot urinate. Summary Gout is painful swelling of the joints caused by inflammation. The most common site of pain is the big toe, but it can affect other joints in the body. Medicines and dietary changes can help to prevent and treat gout attacks. This information is not intended to replace advice given to you by your health care provider. Make sure you discuss any questions you have with your health care provider. Document Revised: 05/11/2018 Document Reviewed: 05/23/2018 Elsevier Patient Education  2022 [employer].    If you have been instructed to have an in-person evaluation today at a local Urgent Care facility, please use the link below. It will take you to a list of all of our available [facility], including address, phone number and hours of operation. Please do not delay care.  [facility]  If you or a family member do not have a primary care provider, use the link below to schedule a visit and establish care. When you choose a [facility] primary care physician or advanced practice provider, you gain a long-term partner in health. Find a Primary Care Provider  Learn more about [facility]'s in-office and virtual care options:  - Get Care Now

## 2021-10-27 NOTE — Progress Notes (Signed)
Virtual Visit Consent   Tyler Best, you are scheduled for a virtual visit with a [facility] provider today.     Just as with appointments in the office, your consent must be obtained to participate.  Your consent will be active for this visit and any virtual visit you may have with one of our providers in the next 365 days.     If you have a MyChart account, a copy of this consent can be sent to you electronically.  All virtual visits are billed to your insurance company just like a traditional visit in the office.    As this is a virtual visit, video technology does not allow for your provider to perform a traditional examination.  This may limit your provider's ability to fully assess your condition.  If your provider identifies any concerns that need to be evaluated in person or the need to arrange testing (such as labs, EKG, etc.), we will make arrangements to do so.     Although advances in technology are sophisticated, we cannot ensure that it will always work on either your end or our end.  If the connection with a video visit is poor, the visit may have to be switched to a telephone visit.  With either a video or telephone visit, we are not always able to ensure that we have a secure connection.     I need to obtain your verbal consent now.   Are you willing to proceed with your visit today?    Neldon Shepard has provided verbal consent on 10/27/2021 for a virtual visit (video or telephone).   Margaretann Loveless, PA-C   Date: 10/27/2021 8:12 AM   Virtual Visit via Video Note   I, Margaretann Loveless, connected with  Tyler Best  ([id number], 11/18/1985) on 10/27/21 at  8:00 AM EST by a video-enabled telemedicine application and verified that I am speaking with the correct person using two identifiers.  Location: Patient: Virtual Visit Location Patient: Home Provider: Virtual Visit Location Provider: Home Office   I discussed the limitations of  evaluation and management by telemedicine and the availability of in person appointments. The patient expressed understanding and agreed to proceed.    History of Present Illness: Tyler Best is a 35 y.o. who identifies as a male who was assigned male at birth, and is being seen today for gout. Last flare was 2 months ago per patient. Looking at chart was seen 10/17, 10/25, and 11/14 for gout in left foot. Reports finally got it cleared up then symptoms started in his right great toe. Current flare started yesterday. Has tried allopurinol and colchicine that has not helped. Prednisone is only thing that helps.  Problems:  Patient Active Problem List   Diagnosis Date Noted   Hyperlipidemia 06/15/2021   Anxiety 05/03/2021   Gout 04/14/2020    Allergies: No Known Allergies Medications:  Current Outpatient Medications:    predniSONE (DELTASONE) 20 MG tablet, Take 40 mg (2 tablets) PO x 3 days then 20mg  (1 tablet) PO x 4 days, Disp: 10 tablet, Rfl: 0   allopurinol (ZYLOPRIM) 100 MG tablet, Take 100 mg by mouth daily., Disp: , Rfl:    colchicine 0.6 MG tablet, Take 1 tablet (0.6 mg total) by mouth 2 (two) times daily as needed. (Patient taking differently: Take 0.6 mg by mouth 2 (two) times daily as needed (gout).), Disp: 60 tablet, Rfl: 1   omeprazole (PRILOSEC) 40 MG capsule, Take 1  capsule (40 mg total) by mouth daily., Disp: 30 capsule, Rfl: 3   omeprazole (PRILOSEC) 40 MG capsule, Take 1 capsule (40 mg total) by mouth in the morning and at bedtime. Twice daily for 6 weeks and then once daily and titrate down, Disp: 90 capsule, Rfl: 3   ondansetron (ZOFRAN) 8 MG tablet, Take 1 tablet (8 mg total) by mouth every 6 (six) hours as needed for nausea or vomiting., Disp: 60 tablet, Rfl: 0   potassium chloride SA (KLOR-CON) 20 MEQ tablet, Take 1 tablet (20 mEq total) by mouth daily for 5 days., Disp: 5 tablet, Rfl: 0   rosuvastatin (CRESTOR) 10 MG tablet, TAKE 1 TABLET(10 MG) BY MOUTH DAILY,  Disp: 30 tablet, Rfl: 2  Observations/Objective: Patient is well-developed, well-nourished in no acute distress.  Resting comfortably at home.  Head is normocephalic, atraumatic.  No labored breathing.  Speech is clear and coherent with logical content.  Patient is alert and oriented at baseline.    Assessment and Plan: 1. Acute idiopathic gout involving toe of right foot - predniSONE (DELTASONE) 20 MG tablet; Take 40 mg (2 tablets) PO x 3 days then 20mg  (1 tablet) PO x 4 days  Dispense: 10 tablet; Refill: 0  - Prednisone prescribed for gout flare - Advised if getting flares every month or so, he should see his PCP for dose adjustment of allopurinol - Seek in person evaluation if not improving or worsening  Follow Up Instructions: I discussed the assessment and treatment plan with the patient. The patient was provided an opportunity to ask questions and all were answered. The patient agreed with the plan and demonstrated an understanding of the instructions.  A copy of instructions were sent to the patient via MyChart unless otherwise noted below.    The patient was advised to call back or seek an in-person evaluation if the symptoms worsen or if the condition fails to improve as anticipated.  Time:  I spent 10 minutes with the patient via telehealth technology discussing the above problems/concerns.    , PA-C

## 2021-12-21 ENCOUNTER — Ambulatory Visit: Payer: 59 | Admitting: Cardiology

## 2022-01-31 ENCOUNTER — Other Ambulatory Visit: Payer: Self-pay | Admitting: Cardiology

## 2022-04-30 IMAGING — DX DG CHEST 2V
2 series · 2 of 2 positions shown · non-contrast
Comparison: 05/21/2007

CLINICAL DATA: Fatigue, nausea, vomiting, weight loss for several
months

EXAM:
CHEST - 2 VIEW

[chest pa]
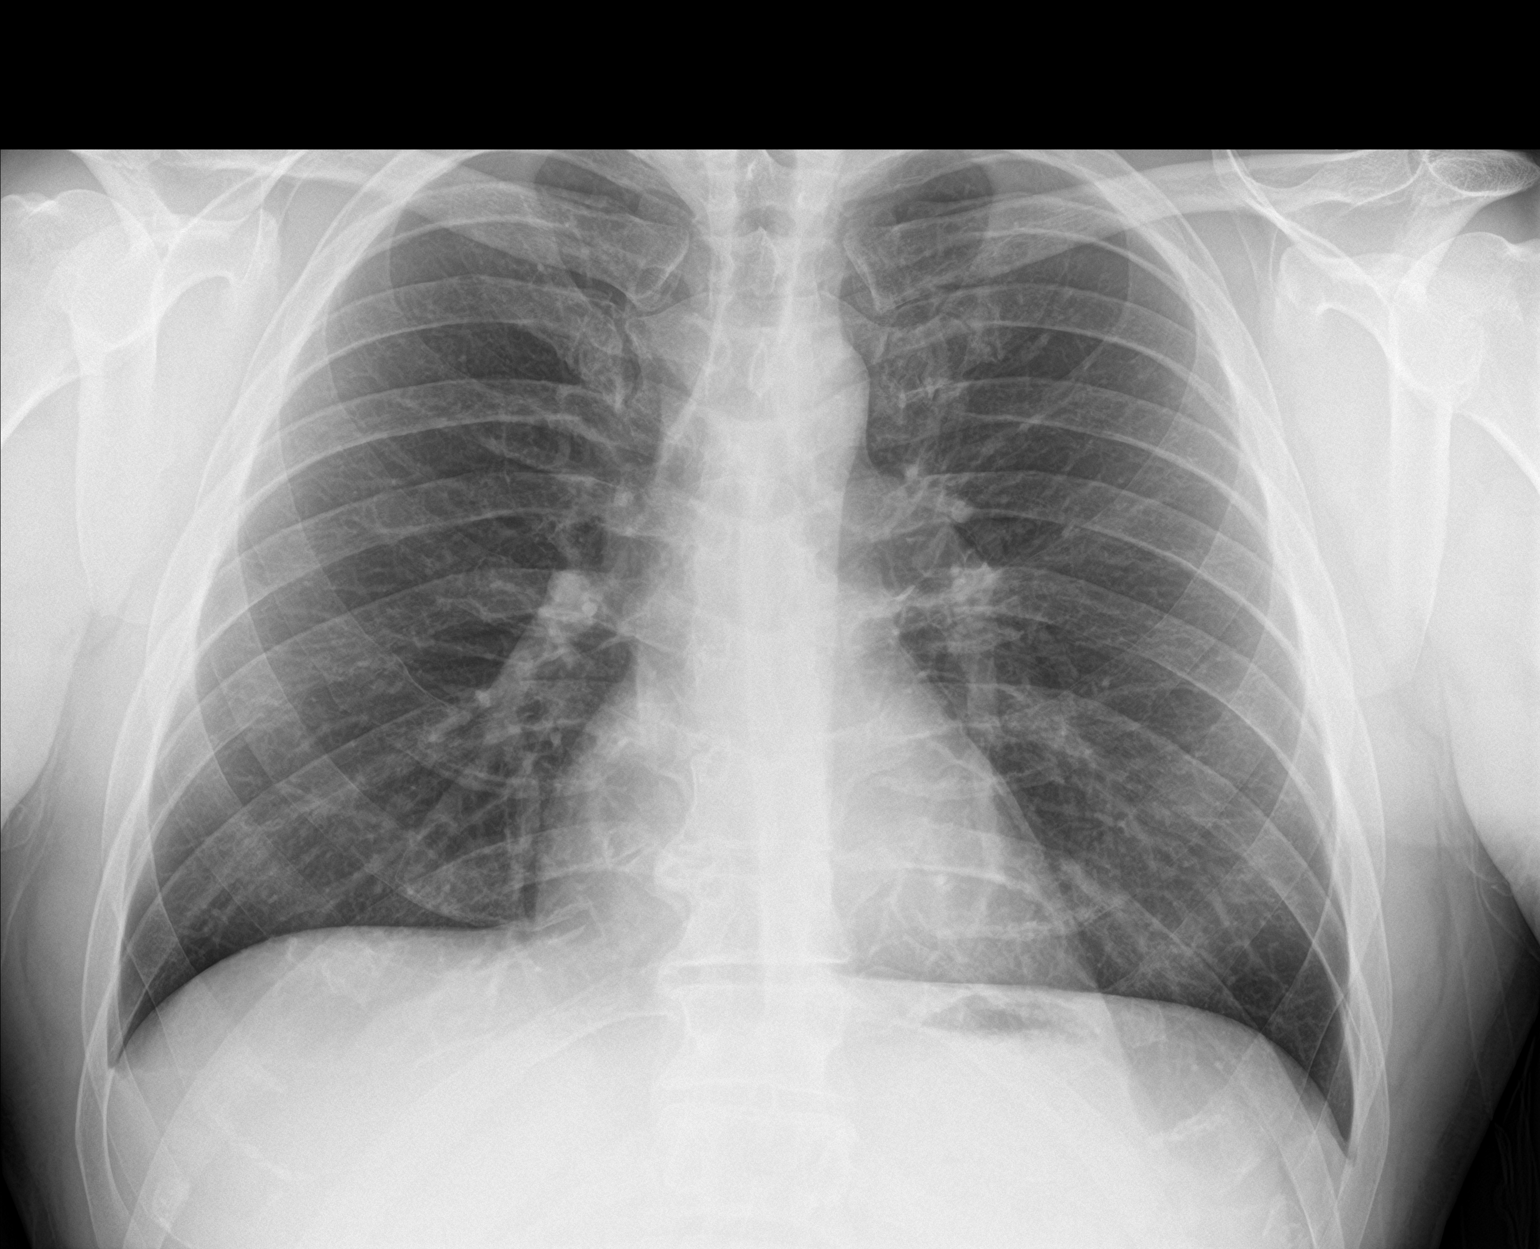

[chest lat]
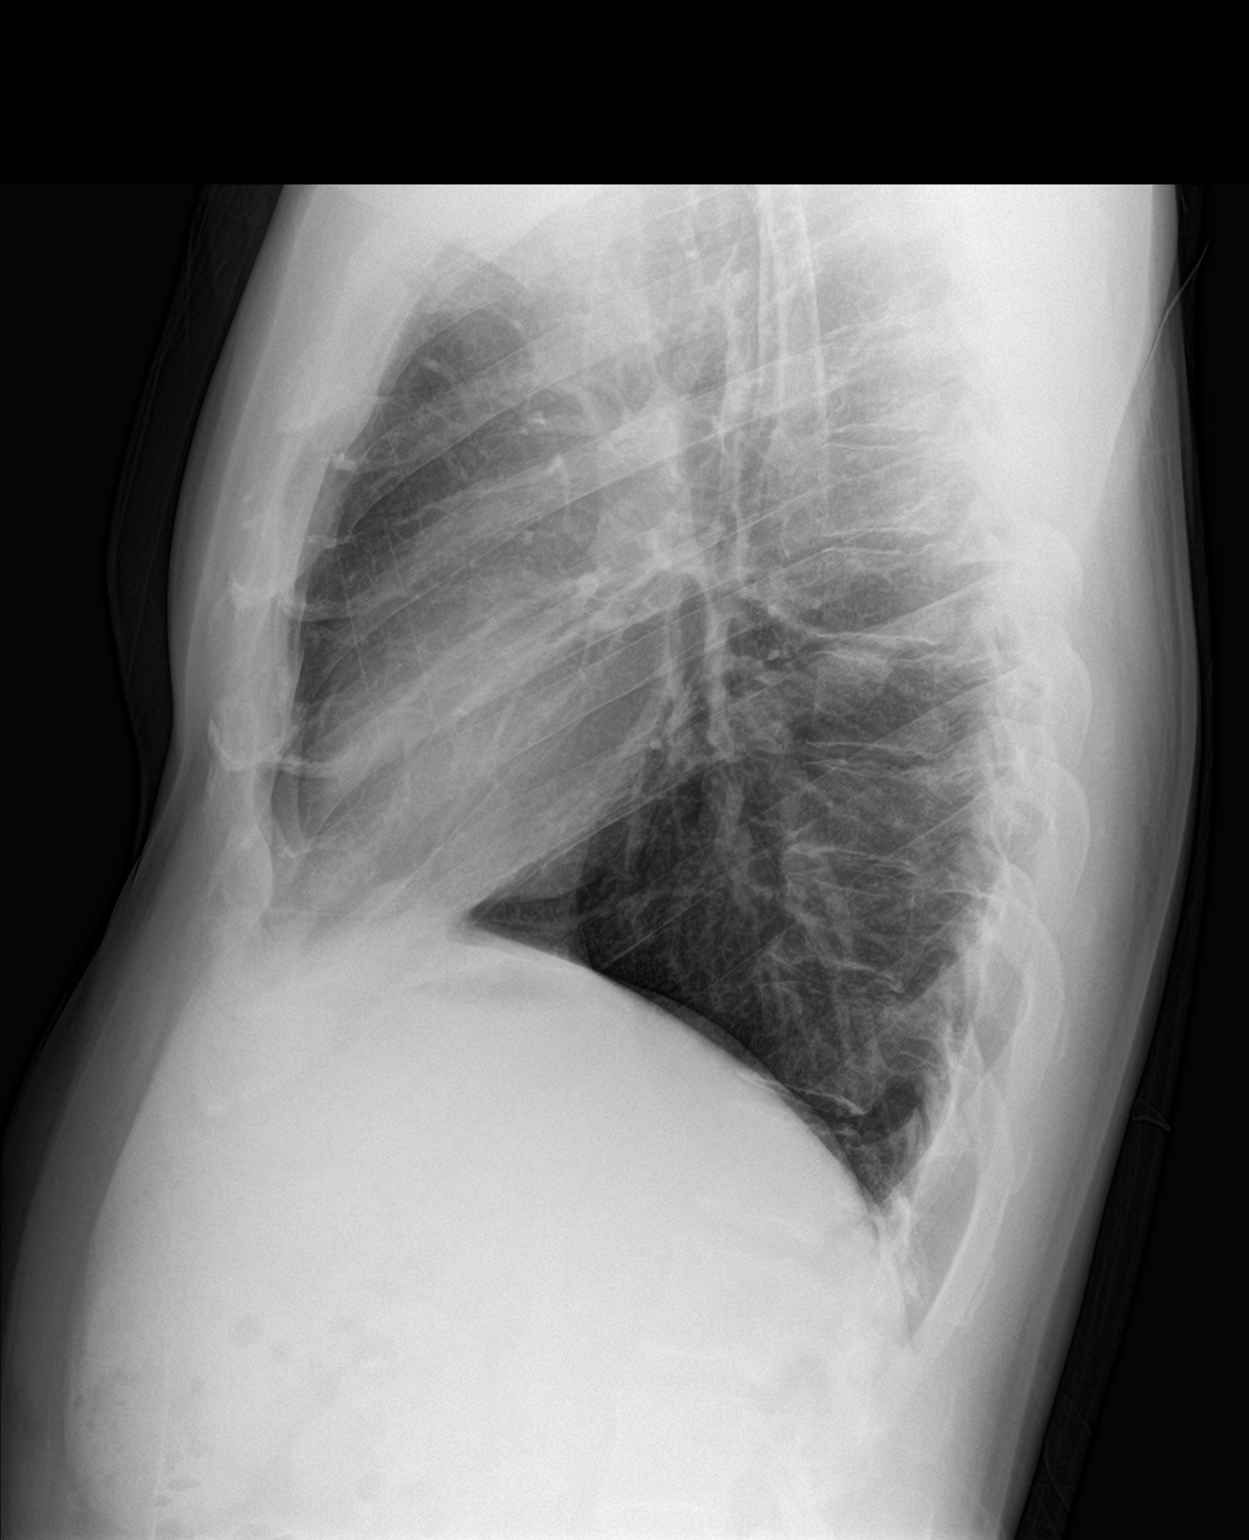

[2 of 2 positions shown; findings below may reference images not displayed]

FINDINGS: Normal heart size, mediastinal contours, and pulmonary vascularity.

Lungs clear.

No pulmonary infiltrate, pleural effusion, or pneumothorax.

Osseous structures unremarkable.
IMPRESSION: Normal exam.

## 2022-10-12 ENCOUNTER — Other Ambulatory Visit: Payer: Self-pay | Admitting: Family Medicine

## 2022-10-13 NOTE — Telephone Encounter (Signed)
Last VV on 07/21/21 Last OV 06/03/21

## 2022-10-14 ENCOUNTER — Other Ambulatory Visit: Payer: Self-pay | Admitting: Family Medicine

## 2022-11-10 ENCOUNTER — Other Ambulatory Visit: Payer: Self-pay | Admitting: Family Medicine

## 2023-01-22 ENCOUNTER — Encounter (HOSPITAL_COMMUNITY): Payer: Self-pay

## 2023-01-22 ENCOUNTER — Other Ambulatory Visit: Payer: Self-pay

## 2023-01-22 ENCOUNTER — Emergency Department (HOSPITAL_COMMUNITY)
Admission: EM | Admit: 2023-01-22 | Discharge: 2023-01-22 | Disposition: A | Discharge status: Home / Self Care | Payer: BC Managed Care – PPO | Attending: Emergency Medicine | Admitting: Emergency Medicine

## 2023-01-22 DIAGNOSIS — F10920 Alcohol use, unspecified with intoxication, uncomplicated: Secondary | ICD-10-CM | POA: Insufficient documentation

## 2023-01-22 DIAGNOSIS — W19XXXA Unspecified fall, initial encounter: Secondary | ICD-10-CM | POA: Insufficient documentation

## 2023-01-22 DIAGNOSIS — S91311A Laceration without foreign body, right foot, initial encounter: Secondary | ICD-10-CM | POA: Diagnosis present

## 2023-01-22 DIAGNOSIS — R030 Elevated blood-pressure reading, without diagnosis of hypertension: Secondary | ICD-10-CM | POA: Insufficient documentation

## 2023-01-22 MED ORDER — LIDOCAINE-EPINEPHRINE (PF) 2 %-1:200000 IJ SOLN
20.0000 mL | Freq: Once | INTRAMUSCULAR | Status: AC
  Administered 2023-01-22: 20 mL
  Filled 2023-01-22: qty 20

## 2023-01-22 NOTE — ED Provider Notes (Signed)
[place] EMERGENCY DEPARTMENT AT [facility] Provider Note   CSN: [id number] Arrival date & time: 01/22/23  0348     History  Chief Complaint  Patient presents with   Laceration    Tyler Best is a 37 y.o. male.  The history is provided by the patient.  Laceration He has history of hyperlipidemia, gout and comes in having suffered a laceration to his right foot.  He admits to having been drinking heavily and fell.  He is not sure how he obtained the laceration.  He is up-to-date on tetanus immunizations.  He denies other injury.   Home Medications Prior to Admission medications   Medication Sig Start Date End Date Taking? Authorizing Provider  allopurinol (ZYLOPRIM) 100 MG tablet Take 100 mg by mouth daily.    [provider]  colchicine 0.6 MG tablet Take 1 tablet (0.6 mg total) by mouth 2 (two) times daily as needed. Patient taking differently: Take 0.6 mg by mouth 2 (two) times daily as needed (gout). 12/09/20   Billie Ruddy, MD  omeprazole (PRILOSEC) 40 MG capsule Take 1 capsule (40 mg total) by mouth daily. 06/03/21   Billie Ruddy, MD  omeprazole (PRILOSEC) 40 MG capsule TAKE 1 CAPSULE(40 MG) BY MOUTH DAILY 11/10/22   Billie Ruddy, MD  ondansetron (ZOFRAN) 8 MG tablet Take 1 tablet (8 mg total) by mouth every 6 (six) hours as needed for nausea or vomiting. 04/07/21   Laurey Morale, MD  potassium chloride SA (KLOR-CON) 20 MEQ tablet Take 1 tablet (20 mEq total) by mouth daily for 5 days. 06/08/21 06/15/21  Billie Ruddy, MD  predniSONE (DELTASONE) 20 MG tablet Take 40 mg (2 tablets) PO x 3 days then '20mg'$  (1 tablet) PO x 4 days 10/27/21   Mar Daring, PA-C  rosuvastatin (CRESTOR) 10 MG tablet Take 1 tablet (10 mg total) by mouth daily. Schedule an appointment for further refills, 1st attempt 02/01/22   Tobb, Godfrey Pick, DO      Allergies    Patient has no known allergies.    Review of Systems   Review of Systems  All other  systems reviewed and are negative.   Physical Exam Updated Vital Signs BP (!) 152/115 (BP Location: Left Arm)   Pulse 90   Temp 98.2 F (36.8 C)   Resp 16   Ht '5\' 10"'$  (1.778 m)   Wt 79.4 kg   SpO2 97%   BMI 25.11 kg/m  Physical Exam Vitals and nursing note reviewed.   37 year old male, resting comfortably and in no acute distress. Vital signs are significant for elevated blood pressure. Oxygen saturation is 97%, which is normal. Head is normocephalic and atraumatic. PERRLA, EOMI. Oropharynx is clear. Neck is nontender and supple. Back is nontender and there is no CVA tenderness. Lungs are clear without rales, wheezes, or rhonchi. Chest is nontender. Heart has regular rate and rhythm without murmur. Abdomen is soft, flat, nontender. Extremities have no cyanosis or edema, full range of motion is present.  Laceration is present on the lateral aspect of the right hindfoot.  Distal neurovascular exam is intact with strong pulses, prompt capillary refill, normal sensation. Skin is warm and dry without rash. Neurologic: Awake and alert but clinically intoxicated with slurred speech.  Moves all extremities equally.    ED Results / Procedures / Treatments    Procedures .Marland KitchenLaceration Repair  Date/Time: 01/22/2023 5:03 AM  Performed by: Delora Fuel, MD Authorized by: Roxanne Mins,  Shanon Brow, MD   Consent:    Consent obtained:  Verbal   Consent given by:  Patient   Risks, benefits, and alternatives were discussed: yes     Risks discussed:  Infection, pain and poor wound healing   Alternatives discussed:  No treatment Universal protocol:    Procedure explained and questions answered to patient or proxy's satisfaction: yes     Relevant documents present and verified: yes     Required blood products, implants, devices, and special equipment available: yes     Site/side marked: yes     Immediately prior to procedure, a time out was called: yes     Patient identity confirmed:  Verbally with  patient and arm band Anesthesia:    Anesthesia method:  Local infiltration   Local anesthetic:  Lidocaine 2% WITH epi Laceration details:    Location:  Foot   Foot location:  R heel   Length (cm):  2.5   Depth (mm):  3 Pre-procedure details:    Preparation:  Patient was prepped and draped in usual sterile fashion Exploration:    Limited defect created (wound extended): no     Hemostasis achieved with:  Direct pressure   Wound exploration: entire depth of wound visualized     Wound extent: fascia not violated, no foreign body, no signs of injury, no nerve damage, no tendon damage and no vascular damage     Contaminated: no   Treatment:    Area cleansed with:  Saline   Amount of cleaning:  Standard   Debridement:  None   Undermining:  None   Scar revision: no   Skin repair:    Repair method:  Sutures   Suture size:  4-0   Suture material:  Nylon   Suture technique:  Simple interrupted   Number of sutures:  4 Approximation:    Approximation:  Close Repair type:    Repair type:  Simple Post-procedure details:    Dressing:  Antibiotic ointment and sterile dressing   Procedure completion:  Tolerated well, no immediate complications     Medications Ordered in ED Medications  lidocaine-EPINEPHrine (XYLOCAINE W/EPI) 2 %-1:200000 (PF) injection 20 mL (has no administration in time range)    ED Course/ Medical Decision Making/ A&P                             Medical Decision Making Risk Prescription drug management.   Laceration of the right foot in the setting of alcohol intoxication.  Laceration is closed with sutures.  I have advised him to have his blood pressure rechecked as an outpatient, and recommended that he limit his alcohol consumption to no more than 2 ounces of alcohol a day.  Final Clinical Impression(s) / ED Diagnoses Final diagnoses:  Laceration of right foot, initial encounter  Elevated blood pressure reading without diagnosis of hypertension  Alcohol  intoxication, uncomplicated ([place])    Rx / DC Orders ED Discharge Orders     None         Delora Fuel, MD [id number] [phone]

## 2023-01-22 NOTE — Discharge Instructions (Signed)
Your blood pressure was high today.  Please have it rechecked several times over the next 1-2 weeks.if it is persistently elevated, he will need to take medication to control it.  Inadequately controlled high blood pressure can lead to heart attacks, strokes, kidney failure.  Please limit your alcohol consumption to 2 drinks a day.  That would be 2 ounces of hard liquor, or 24 ounces of beer, or 12 ounces of wine.

## 2023-01-22 NOTE — ED Triage Notes (Signed)
Pt to ED by EMS from home c/o laceration to his right foot approx 1/2 an inch deep. Pt is highly intoxicated and states he cut it on the cabinet. Pt verbally aggressive to EMS upon arrival, also endorses drinking half a bottle of burbon which he states is normal for him. Pt arrives tachycardic, all other vss, nadn.

## 2023-11-24 ENCOUNTER — Telehealth: Payer: Self-pay | Admitting: Family Medicine

## 2023-11-24 NOTE — Telephone Encounter (Signed)
 Last visit:  07/21/2021 Called Pt to schedule a CPE/OV.  LVM for Pt to call us back to schedule.

## 2024-02-23 ENCOUNTER — Telehealth: Payer: Self-pay | Admitting: Family Medicine

## 2024-02-23 NOTE — Telephone Encounter (Signed)
 Copied from CRM [id number]. Topic: Appointments - Appointment Scheduling >> Feb 22, 2024  4:19 PM Sim Boast F wrote: Patient mother Roxas Clymer called, she is a patient of Dr. Latrelle Dodrill and wants to know if he will be open to taking her son Tyler Best on as a new patient due to drinking problems/ She request a call back at [phone] with an update.

## 2024-02-23 NOTE — Telephone Encounter (Signed)
 No, will not be taking him as a new patient.  Thanks.

## 2024-04-26 NOTE — Telephone Encounter (Signed)
"  °-  Calling to see if patient can return to the clinic to finish his appointment. He stated he left after seeing the LPN because he was not feeling well and vomiting.  Encouraged to return to be re-evaluated.  He stated he may make an appointment for another day.  He stated that he really just wanted his BP and temperature taken.                                              "

## 2024-04-26 NOTE — Progress Notes (Signed)
"  °  MinuteClinic Visit Note    Tyler Best is a 38 y.o. male    History unable to be obtained by patient due to patient left prior to evaluation from provider.   Assessment & Plan:    Assessment & Plan Acute pharyngitis, unspecified etiology  Orders:   Strep Molecular POCT -Patient left after check-in with LPN.  He was not evaluated by the provider.  Called patient to see if he would return to the clinic to finish visit.  Patient stated he had to leave because he started vomiting and stated he just wanted to see if he had a fever and if his BP was ok.  He stated he would maybe make an appointment for another day.  Advised patient of negative strep results.   No follow-ups on file.  Subjective     Mouth or throat complaint Duration of current symptoms:  4 days Associated symptoms: sore throat and swollen glands   Special considerations:  None apply    No Known Allergies  Review of Systems  HENT:  Positive for sore throat.      Objective     Vital Signs: BP 118/79 (Site: Right arm, Position: Sitting, Cuff Size: Adult)   Pulse 98   Temp 97.5 F (36.4 C) (Tympanic)   Resp 18   Ht 5' 9 (1.753 m)   Wt 175 lb (79.4 kg)   SpO2 98%   BMI 25.84 kg/m    Physical Exam                 "

## 2024-11-09 ENCOUNTER — Inpatient Hospital Stay (HOSPITAL_COMMUNITY)
Admission: EM | Admit: 2024-11-09 | Discharge: 2024-11-12 | DRG: 896 | Disposition: A | Discharge status: Home / Self Care | Payer: MEDICAID | Attending: Internal Medicine | Admitting: Internal Medicine

## 2024-11-09 DIAGNOSIS — Y908 Blood alcohol level of 240 mg/100 ml or more: Secondary | ICD-10-CM | POA: Diagnosis present

## 2024-11-09 DIAGNOSIS — R1110 Vomiting, unspecified: Secondary | ICD-10-CM | POA: Diagnosis present

## 2024-11-09 DIAGNOSIS — R7401 Elevation of levels of liver transaminase levels: Secondary | ICD-10-CM | POA: Diagnosis present

## 2024-11-09 DIAGNOSIS — E872 Acidosis, unspecified: Secondary | ICD-10-CM | POA: Diagnosis present

## 2024-11-09 DIAGNOSIS — F10221 Alcohol dependence with intoxication delirium: Secondary | ICD-10-CM | POA: Diagnosis present

## 2024-11-09 DIAGNOSIS — S0990XA Unspecified injury of head, initial encounter: Secondary | ICD-10-CM | POA: Diagnosis present

## 2024-11-09 DIAGNOSIS — J9601 Acute respiratory failure with hypoxia: Secondary | ICD-10-CM | POA: Diagnosis present

## 2024-11-09 DIAGNOSIS — G9341 Metabolic encephalopathy: Secondary | ICD-10-CM | POA: Diagnosis present

## 2024-11-09 DIAGNOSIS — F10929 Alcohol use, unspecified with intoxication, unspecified: Secondary | ICD-10-CM | POA: Diagnosis not present

## 2024-11-09 DIAGNOSIS — G929 Unspecified toxic encephalopathy: Secondary | ICD-10-CM | POA: Diagnosis not present

## 2024-11-09 DIAGNOSIS — R12 Heartburn: Secondary | ICD-10-CM | POA: Diagnosis not present

## 2024-11-09 DIAGNOSIS — G4700 Insomnia, unspecified: Secondary | ICD-10-CM | POA: Diagnosis present

## 2024-11-09 DIAGNOSIS — E162 Hypoglycemia, unspecified: Secondary | ICD-10-CM | POA: Diagnosis not present

## 2024-11-09 DIAGNOSIS — Z781 Physical restraint status: Secondary | ICD-10-CM | POA: Diagnosis not present

## 2024-11-09 DIAGNOSIS — S0083XA Contusion of other part of head, initial encounter: Secondary | ICD-10-CM | POA: Diagnosis present

## 2024-11-09 DIAGNOSIS — I959 Hypotension, unspecified: Secondary | ICD-10-CM | POA: Diagnosis not present

## 2024-11-09 DIAGNOSIS — E785 Hyperlipidemia, unspecified: Secondary | ICD-10-CM | POA: Diagnosis present

## 2024-11-09 DIAGNOSIS — J690 Pneumonitis due to inhalation of food and vomit: Secondary | ICD-10-CM | POA: Diagnosis present

## 2024-11-09 DIAGNOSIS — G928 Other toxic encephalopathy: Secondary | ICD-10-CM | POA: Diagnosis present

## 2024-11-09 DIAGNOSIS — R4182 Altered mental status, unspecified: Secondary | ICD-10-CM | POA: Diagnosis not present

## 2024-11-09 DIAGNOSIS — R569 Unspecified convulsions: Secondary | ICD-10-CM | POA: Diagnosis not present

## 2024-11-09 LAB — COMPREHENSIVE METABOLIC PANEL WITH GFR
ALT: 124 U/L — ABNORMAL HIGH (ref 0–44)
AST: 162 U/L — ABNORMAL HIGH (ref 15–41)
Albumin: 4.9 g/dL (ref 3.5–5.0)
Alkaline Phosphatase: 72 U/L (ref 38–126)
Anion gap: 16 — ABNORMAL HIGH (ref 5–15)
BUN: 7 mg/dL (ref 6–20)
CO2: 25 mmol/L (ref 22–32)
Calcium: 8.5 mg/dL — ABNORMAL LOW (ref 8.9–10.3)
Chloride: 100 mmol/L (ref 98–111)
Creatinine, Ser: 0.93 mg/dL (ref 0.61–1.24)
GFR, Estimated: >60 mL/min
Glucose, Bld: 121 mg/dL — ABNORMAL HIGH (ref 70–99)
Potassium: 3.6 mmol/L (ref 3.5–5.1)
Sodium: 142 mmol/L (ref 135–145)
Total Bilirubin: 0.4 mg/dL (ref 0.0–1.2)
Total Protein: 7.3 g/dL (ref 6.5–8.1)

## 2024-11-09 LAB — I-STAT ARTERIAL BLOOD GAS, ED
Acid-Base Excess: 0 mmol/L (ref 0.0–2.0)
Bicarbonate: 25.9 mmol/L (ref 20.0–28.0)
Calcium, Ion: 1 mmol/L — ABNORMAL LOW (ref 1.15–1.40)
HCT: 40 % (ref 39.0–52.0)
Hemoglobin: 13.6 g/dL (ref 13.0–17.0)
O2 Saturation: 100 %
Patient temperature: 35.6
Potassium: 3.7 mmol/L (ref 3.5–5.1)
Sodium: 139 mmol/L (ref 135–145)
TCO2: 27 mmol/L (ref 22–32)
pCO2 arterial: 43.7 mmHg (ref 32–48)
pH, Arterial: 7.374 (ref 7.35–7.45)
pO2, Arterial: 378 mmHg — ABNORMAL HIGH (ref 83–108)

## 2024-11-09 LAB — ETHANOL: Alcohol, Ethyl (B): >450 mg/dL

## 2024-11-09 LAB — CBC
HCT: 44.6 % (ref 39.0–52.0)
Hemoglobin: 15.2 g/dL (ref 13.0–17.0)
MCH: 34 pg (ref 26.0–34.0)
MCHC: 34.1 g/dL (ref 30.0–36.0)
MCV: 99.8 fL (ref 80.0–100.0)
Platelets: 196 K/uL (ref 150–400)
RBC: 4.47 MIL/uL (ref 4.22–5.81)
RDW: 13.7 % (ref 11.5–15.5)
WBC: 9.4 K/uL (ref 4.0–10.5)
nRBC: 0 % (ref 0.0–0.2)

## 2024-11-09 LAB — LACTIC ACID, PLASMA
Lactic Acid, Venous: 4.9 mmol/L (ref 0.5–1.9)
Lactic Acid, Venous: 6.2 mmol/L (ref 0.5–1.9)

## 2024-11-09 LAB — I-STAT CHEM 8, ED
BUN: 6 mg/dL (ref 6–20)
Calcium, Ion: 0.95 mmol/L — ABNORMAL LOW (ref 1.15–1.40)
Chloride: 102 mmol/L (ref 98–111)
Creatinine, Ser: 1.5 mg/dL — ABNORMAL HIGH (ref 0.61–1.24)
Glucose, Bld: 119 mg/dL — ABNORMAL HIGH (ref 70–99)
HCT: 45 % (ref 39.0–52.0)
Hemoglobin: 15.3 g/dL (ref 13.0–17.0)
Potassium: 3.5 mmol/L (ref 3.5–5.1)
Sodium: 144 mmol/L (ref 135–145)
TCO2: 25 mmol/L (ref 22–32)

## 2024-11-09 LAB — CK TOTAL AND CKMB (NOT AT ARMC)
CK, MB: 2 ng/mL (ref 0.5–5.0)
Total CK: 603 U/L — ABNORMAL HIGH (ref 49–397)

## 2024-11-09 LAB — PROTIME-INR
INR: 1.1 (ref 0.8–1.2)
Prothrombin Time: 14.6 s (ref 11.4–15.2)

## 2024-11-09 LAB — GLUCOSE, CAPILLARY: Glucose-Capillary: 83 mg/dL (ref 70–99)

## 2024-11-09 LAB — I-STAT CG4 LACTIC ACID, ED: Lactic Acid, Venous: 3 mmol/L (ref 0.5–1.9)

## 2024-11-09 LAB — SAMPLE TO BLOOD BANK

## 2024-11-09 MED ORDER — FOLIC ACID 1 MG PO TABS
1.0000 mg | ORAL_TABLET | Freq: Every day | ORAL | Status: DC
  Administered 2024-11-09 – 2024-11-10 (×2): 1 mg
  Filled 2024-11-09 (×2): qty 1

## 2024-11-09 MED ORDER — THIAMINE HCL 100 MG/ML IJ SOLN
200.0000 mg | INTRAVENOUS | Status: DC
  Filled 2024-11-09 (×2): qty 2

## 2024-11-09 MED ORDER — DOCUSATE SODIUM 50 MG/5ML PO LIQD
100.0000 mg | Freq: Two times a day (BID) | ORAL | Status: DC
  Administered 2024-11-09 – 2024-11-10 (×2): 100 mg
  Filled 2024-11-09 (×2): qty 10

## 2024-11-09 MED ADMIN — AMPICILLIN-SULBACTAM 3 GM IVPB: 3 g | INTRAVENOUS | NDC 55150011720

## 2024-11-09 MED ADMIN — Fentanyl Citrate PF Soln Prefilled Syringe 50 MCG/ML: 100 ug | INTRAVENOUS | NDC 63323080801

## 2024-11-09 MED ADMIN — Fentanyl Citrate PF Soln Prefilled Syringe 50 MCG/ML: 200 ug | INTRAVENOUS | NDC 63323080801

## 2024-11-09 MED ADMIN — Phenobarbital Sodium Inj 130 MG/ML: 130 mg | INTRAVENOUS | NDC 54288013701

## 2024-11-09 MED ADMIN — FOLIC ACID IVPB: 1 mg | INTRAVENOUS | NDC 39822110001

## 2024-11-09 MED ADMIN — Propofol IV Emul 1000 MG/100ML (10 MG/ML): 15 ug/kg/min | INTRAVENOUS | NDC 00069024801

## 2024-11-09 MED ADMIN — Propofol IV Emul 1000 MG/100ML (10 MG/ML): 75 ug/kg/min | INTRAVENOUS | NDC 00069024801

## 2024-11-09 MED ADMIN — Propofol IV Emul 1000 MG/100ML (10 MG/ML): 30 ug/kg/min | INTRAVENOUS | NDC 00069024801

## 2024-11-09 MED ADMIN — THERA M PLUS PO TABS: 1 | NDC 1650058694

## 2024-11-09 MED ADMIN — Naloxone HCl Soln Prefilled Syringe 2 MG/2ML: 2 mg | INTRAVENOUS | NDC 76329336901

## 2024-11-09 MED ADMIN — Fentanyl Citrate PF Soln Prefilled Syringe 50 MCG/ML: 50 ug | INTRAVENOUS | NDC 63323080801

## 2024-11-09 MED ADMIN — Etomidate IV Soln 2 MG/ML: 20 mg | INTRAVENOUS | NDC 00409669501

## 2024-11-09 MED ADMIN — Thiamine HCl Inj 100 MG/ML: 100 mg | INTRAVENOUS | NDC 63323001301

## 2024-11-09 MED ADMIN — Rocuronium Bromide IV Soln Pref Syr 100 MG/10ML (10 MG/ML): 120 mg | INTRAVENOUS | NDC 99999070048

## 2024-11-09 MED ADMIN — Thiamine Mononitrate Tab 100 MG: 100 mg | NDC 54629005701

## 2024-11-09 MED ADMIN — Iohexol IV Soln 350 MG/ML: 75 mL | INTRAVENOUS | NDC 00407141490

## 2024-11-09 MED ADMIN — Lactated Ringer's Solution: 1000 mL | INTRAVENOUS | NDC 00264775000

## 2024-11-09 MED ADMIN — PHENOBARBITAL > 130MG TO 1000MG IVPB: 260 mg | INTRAVENOUS | NDC 00641047721

## 2024-11-09 MED FILL — Phenobarbital Sodium Inj 130 MG/ML: 130.0000 mg | INTRAMUSCULAR | Qty: 1 | Status: AC

## 2024-11-09 MED FILL — Phenobarbital Sodium Inj 130 MG/ML: 260.0000 mg | INTRAMUSCULAR | Qty: 2 | Status: AC

## 2024-11-09 MED FILL — Propofol IV Emul 1000 MG/100ML (10 MG/ML): 0.0000 ug/kg/min | INTRAVENOUS | Qty: 100 | Status: AC

## 2024-11-09 MED FILL — Polyethylene Glycol 3350 Oral Packet 17 GM: 17.0000 g | ORAL | Qty: 1 | Status: CN

## 2024-11-09 MED FILL — Midazolam HCl Inj 2 MG/2ML (Base Equivalent): 1.0000 mg | INTRAMUSCULAR | Qty: 2 | Status: AC

## 2024-11-09 MED FILL — Multiple Vitamins w/ Minerals Tab: 1.0000 | ORAL | Qty: 1 | Status: AC

## 2024-11-09 MED FILL — Fentanyl Citrate PF Soln Prefilled Syringe 50 MCG/ML: 50.0000 ug | INTRAMUSCULAR | Qty: 2 | Status: AC

## 2024-11-09 MED FILL — Fentanyl Citrate PF Soln Prefilled Syringe 50 MCG/ML: 50.0000 ug | INTRAMUSCULAR | Qty: 4 | Status: AC

## 2024-11-09 MED FILL — Thiamine HCl Inj 100 MG/ML: 100.0000 mg | INTRAMUSCULAR | Qty: 2 | Status: AC

## 2024-11-09 MED FILL — Ampicillin & Sulbactam Sodium For Inj 3 (2-1) GM: 3.0000 g | INTRAMUSCULAR | Qty: 8 | Status: AC

## 2024-11-09 MED FILL — Thiamine Mononitrate Tab 100 MG: 100.0000 mg | ORAL | Qty: 1 | Status: AC

## 2024-11-09 MED FILL — Folic Acid Inj 5 MG/ML: 1.0000 mg | INTRAMUSCULAR | Qty: 0.2 | Status: AC

## 2024-11-09 NOTE — Progress Notes (Addendum)
" °   11/09/24 1335  Spiritual Encounters  Type of Visit Attempt (pt unavailable)  Care provided to: Pt not available  Reason for visit Trauma  OnCall Visit Yes    Chaplain responded to Level 1 page. Pt unavailable. No family present. Chaplains continue to remain available as needed. "

## 2024-11-09 NOTE — ED Provider Notes (Signed)
 " [place] EMERGENCY DEPARTMENT AT [facility] Provider Note   CSN: [id number] Arrival date & time: 11/09/24  1332     Patient presents with: LEVEL 1 TRAUMA    Tyler Best is a 38 y.o. male.   HPI   Patient has history of gout and hyperlipidemia.  Patient also reportedly has a history of heavy alcohol use.  He presents to the ED for evaluation of altered mental status.  Patient presented as a level 1 trauma.  He was last seen normal last evening.  Patient was found lying unresponsive supine on the ground.  Family did chest compressions at home.  EMS noted the patient vomited.  His oxygen saturation decreased.  They placed a nasal trumpet and provided bag-valve-mask assisted ventilation.  Prior to Admission medications  Not on File    Allergies: Patient has no allergy information on record.    Review of Systems  Updated Vital Signs BP (!) 139/106   Pulse 97   Temp (!) 96.2 F (35.7 C) (Temporal)   Resp (!) 21   Ht 1.829 m (6')   Wt 90 kg   SpO2 100%   BMI 26.91 kg/m   Physical Exam Vitals and nursing note reviewed.  Constitutional:      Appearance: He is well-developed.  HENT:     Head: Normocephalic.     Comments: Small amount of bruising noted around the left eye    Right Ear: External ear normal.     Left Ear: External ear normal.  Eyes:     General: No scleral icterus.       Right eye: No discharge.        Left eye: No discharge.     Conjunctiva/sclera: Conjunctivae normal.  Neck:     Trachea: No tracheal deviation.  Cardiovascular:     Rate and Rhythm: Normal rate and regular rhythm.  Pulmonary:     Effort: Pulmonary effort is normal. No respiratory distress.     Breath sounds: Normal breath sounds. No stridor. No wheezing or rales.  Abdominal:     General: Bowel sounds are normal. There is no distension.     Palpations: Abdomen is soft.     Tenderness: There is no abdominal tenderness. There is no guarding or rebound.   Musculoskeletal:        General: No deformity.     Cervical back: Neck supple.  Skin:    General: Skin is warm and dry.     Findings: No rash.  Neurological:     Cranial Nerves: No dysarthria or facial asymmetry.     Motor: No abnormal muscle tone or seizure activity.     Comments: Patient unresponsive GCS 3     (all labs ordered are listed, but only abnormal results are displayed) Labs Reviewed  COMPREHENSIVE METABOLIC PANEL WITH GFR - Abnormal; Notable for the following components:      Result Value   Glucose, Bld 121 (*)    Calcium  8.5 (*)    AST 162 (*)    ALT 124 (*)    Anion gap 16 (*)    All other components within normal limits  ETHANOL - Abnormal; Notable for the following components:   Alcohol, Ethyl (B) >450 (*)    All other components within normal limits  CK TOTAL AND CKMB (NOT AT [facility]) - Abnormal; Notable for the following components:   Total CK 603 (*)    All other components within normal limits  I-STAT  CHEM 8, ED - Abnormal; Notable for the following components:   Creatinine, Ser 1.50 (*)    Glucose, Bld 119 (*)    Calcium , Ion 0.95 (*)    All other components within normal limits  I-STAT CG4 LACTIC ACID, ED - Abnormal; Notable for the following components:   Lactic Acid, Venous 3.0 (*)    All other components within normal limits  I-STAT ARTERIAL BLOOD GAS, ED - Abnormal; Notable for the following components:   pO2, Arterial 378 (*)    Calcium , Ion 1.00 (*)    All other components within normal limits  CULTURE, BLOOD (ROUTINE X 2)  CULTURE, BLOOD (ROUTINE X 2)  CBC  PROTIME-INR  URINALYSIS, ROUTINE W REFLEX MICROSCOPIC  BLOOD GAS, ARTERIAL  URINE DRUG SCREEN  SAMPLE TO BLOOD BANK    EKG: None  Radiology: CT CHEST ABDOMEN PELVIS W CONTRAST Result Date: 11/09/2024 EXAM: CT CHEST, ABDOMEN AND PELVIS WITH CONTRAST 11/09/2024 02:28:24 PM TECHNIQUE: CT of the chest, abdomen and pelvis was performed with the administration of 75 mL of iohexol   (OMNIPAQUE ) 350 MG/ML injection. Multiplanar reformatted images are provided for review. Automated exposure control, iterative reconstruction, and/or weight based adjustment of the mA/kV was utilized to reduce the radiation dose to as low as reasonably achievable. COMPARISON: Chest x-ray 12:27:25 CLINICAL HISTORY: Polytrauma, blunt. FINDINGS: CHEST: MEDIASTINUM AND LYMPH NODES: Heart and pericardium are grossly unremarkable. Markedly limited evaluation of the aorta. Endotracheal tube within the trachea terminating approximately 3 to 4 cm above the carina. LUNGS AND PLEURA: Right lower lobe peribronchovascular and dependent low and high density airspace opacities. Right lower lobe aspiration likely. No pleural effusion or pneumothorax. LIMITATIONS/ARTIFACTS: Markedly limited evaluation of the chest, ribs, shoulders, and thoracic spine due to motion artifact. ABDOMEN AND PELVIS: LIVER: The liver is unremarkable. GALLBLADDER AND BILE DUCTS: Gallbladder is unremarkable. No biliary ductal dilatation. SPLEEN: No acute abnormality. PANCREAS: No acute abnormality. ADRENAL GLANDS: No acute abnormality. KIDNEYS, URETERS AND BLADDER: No stones in the kidneys or ureters. No hydronephrosis. No perinephric or periureteral stranding. No filling defects of the partially visualized collecting systems on delayed imaging. Urinary bladder is unremarkable. GI AND BOWEL: Enteric tube with tip in side port within the gastric lumen. Question distal esophageal wall thickening. No small or large bowel thickening or dilatation. Status post appendectomy. REPRODUCTIVE ORGANS: No acute abnormality. PERITONEUM AND RETROPERITONEUM: No ascites. No free air. VASCULATURE: Aorta is normal in caliber. ABDOMINAL AND PELVIS LYMPH NODES: No lymphadenopathy. BONES AND SOFT TISSUES: No acute osseous abnormality of the abdomen and pelvis. Evaluation of the ribs and THORACIC SPINE is markedly limited due to motion artifact. No focal soft tissue abnormality.  No acute fracture or dislocation of either hip. No acute displaced fracture of the lumbar spine. No traumatic malalignment of the lumbar spine. No acute fracture of the sacrum. IMPRESSION: 1. Markedly limited evaluation of the chest, ribs, shoulders, and thoracic spine due to motion artifact. 2. Right lower lobe aspiration likely. 3. Possible distal esophageal wall thickening. Correlate with signs and symptoms of esophagitis. 4. Otherwise, no definite acute traumatic injury. 5. These findings were discussed with doctor Gerkins on the phone by doctor Naveau on 11/09/24 at 2:19 pm. Electronically signed by: Kate Plummer MD 11/09/2024 02:49 PM EST RP Workstation: [id number]   CT CERVICAL SPINE WO CONTRAST Result Date: 11/09/2024 EXAM: CT CERVICAL SPINE WITHOUT CONTRAST 11/09/2024 02:28:24 PM TECHNIQUE: CT of the cervical spine was performed without the administration of intravenous contrast. Multiplanar reformatted images are provided for review.  Automated exposure control, iterative reconstruction, and/or weight based adjustment of the mA/kV was utilized to reduce the radiation dose to as low as reasonably achievable. COMPARISON: None available. CLINICAL HISTORY: Polytrauma, blunt. FINDINGS: BONES AND ALIGNMENT: No acute fracture or traumatic malalignment. DEGENERATIVE CHANGES: No significant degenerative changes. SOFT TISSUES: Endotracheal and orogastric tube. No prevertebral soft tissue swelling. IMPRESSION: 1. No evidence of acute traumatic injury to the cervical spine. 2. Endotracheal and orogastric tubes are present. 3. These findings were discussed with doctor Gerkins on the phone by doctor Naveau on 11/09/24 at 2:30 pm Electronically signed by: Morgane Naveau MD 11/09/2024 02:44 PM EST RP Workstation: [id number]   CT MAXILLOFACIAL WO CONTRAST Result Date: 11/09/2024 EXAM: CT OF THE FACE WITHOUT CONTRAST 11/09/2024 97:71:75 PM TECHNIQUE: CT of the face was performed without the administration of  intravenous contrast. Multiplanar reformatted images are provided for review. Automated exposure control, iterative reconstruction, and/or weight based adjustment of the mA/kV was utilized to reduce the radiation dose to as low as reasonably achievable. COMPARISON: None available. CLINICAL HISTORY: Facial trauma, blunt. found unresponsive FINDINGS: FACIAL BONES: No acute facial fracture. No mandibular dislocation. No suspicious bone lesion. ORBITS: Globes are intact. No acute traumatic injury. No inflammatory change. Mild right periorbital soft tissue edema. SINUSES AND MASTOIDS: Bilateral maxillary sinus mucosal thickening. Left sphenoid sinus mucosal thickening. No acute abnormality in the mastoids. SOFT TISSUES: Orogastric tube noted coursing within the esophagus. Endotracheal tube noted coursing within the trachea. Mild right periorbital soft tissue edema. No large hematoma formation. No retained radiopaque foreign body. IMPRESSION: 1. No acute facial fracture. 2. These findings were discussed with doctor Gerkins on the phone by doctor Naveau on 11/09/24 at 2:30 pm. Electronically signed by: Morgane Naveau MD 11/09/2024 02:42 PM EST RP Workstation: [id number]   CT HEAD WO CONTRAST Result Date: 11/09/2024 EXAM: CT HEAD WITHOUT CONTRAST 11/09/2024 02:28:24 PM TECHNIQUE: CT of the head was performed without the administration of intravenous contrast. Automated exposure control, iterative reconstruction, and/or weight based adjustment of the mA/kV was utilized to reduce the radiation dose to as low as reasonably achievable. COMPARISON: None available. CLINICAL HISTORY: Head trauma, moderate-severe. FINDINGS: BRAIN AND VENTRICLES: No acute hemorrhage. No evidence of acute infarct. No hydrocephalus. No extra-axial collection. No mass effect or midline shift. ORBITS: No acute abnormality. SINUSES: Mild mucosal thickening throughout paranasal sinuses. SOFT TISSUES AND SKULL: Endotracheal and enteric tubes in place,  partially included. No acute soft tissue abnormality. No skull fracture. IMPRESSION: 1. No acute intracranial abnormality. 2. Findings discussed with Dr. Eletha at 2:36 PM on 11/09/24. Electronically signed by: Donnice Mania MD 11/09/2024 02:41 PM EST RP Workstation: [id number]   DG Chest Port 1 View Result Date: 11/09/2024 CLINICAL DATA:  Level 1 trauma. EXAM: PORTABLE CHEST 1 VIEW COMPARISON:  05/26/2021. FINDINGS: The heart size and mediastinal contours are within normal limits. No consolidation, effusion, or pneumothorax is seen. An enteric tube courses over the stomach and out of the field of view. An endotracheal tube terminates 5.6 cm above the carina. No acute osseous abnormality. IMPRESSION: 1. No active disease. 2. Support apparatus as described above. Electronically Signed   By: Leita Birmingham M.D.   On: 11/09/2024 14:06     .Critical Care  Performed by: Randol Simmonds, MD Authorized by: Randol Simmonds, MD   Critical care provider statement:    Critical care time (minutes):  30   Critical care was time spent personally by me on the following activities:  Development of treatment plan with patient or  surrogate, discussions with consultants, evaluation of patient's response to treatment, examination of patient, ordering and review of laboratory studies, ordering and review of radiographic studies, ordering and performing treatments and interventions, pulse oximetry, re-evaluation of patient's condition and review of old charts Procedure Name: Intubation Date/Time: 11/09/2024 2:09 PM  Performed by: Randol Simmonds, MDPre-anesthesia Checklist: Patient identified, Patient being monitored, Emergency Drugs available, Timeout performed and Suction available Oxygen Delivery Method: Non-rebreather mask Preoxygenation: Pre-oxygenation with 100% oxygen Induction Type: Rapid sequence Ventilation: Mask ventilation without difficulty Laryngoscope Size: Glidescope Number of attempts: 1 Airway Equipment and  Method: Video-laryngoscopy Placement Confirmation: ETT inserted through vocal cords under direct vision, CO2 detector and Breath sounds checked- equal and bilateral Tube secured with: ETT holder Dental Injury: Teeth and Oropharynx as per pre-operative assessment        Medications Ordered in the ED  etomidate  (AMIDATE ) injection (20 mg Intravenous Given 11/09/24 1341)  rocuronium  (ZEMURON ) injection (120 mg Intravenous Given 11/09/24 1341)  naloxone  (NARCAN ) injection 2 mg (2 mg Intravenous Given 11/09/24 1340)  iohexol  (OMNIPAQUE ) 350 MG/ML injection 75 mL (75 mLs Intravenous Contrast Given 11/09/24 1430)    Clinical Course as of 11/09/24 1457  Sat Nov 09, 2024  1435 CK total and CKMB(!) CK increased at 603.  Metabolic panel does show increased LFTs.  Lactic acid levels elevated [JK]  1438 ETOH reported at 450 [JK]  1444 Case discussed with NP Babcock [JK]  1454 Family is at the bedside.  I updated patient's brothers.  They gave additional information that he was texting someone earlier this morning maybe around 9 AM. [JK]    Clinical Course User Index [JK] Randol Simmonds, MD                                 Medical Decision Making Differential diagnosis includes but not limited to cerebral hemorrhage, traumatic brain hemorrhage  Problems Addressed: Alcoholic intoxication with complication: acute illness or injury that poses a threat to life or bodily functions Injury of head, initial encounter: acute illness or injury that poses a threat to life or bodily functions  Amount and/or Complexity of Data Reviewed Labs: ordered. Decision-making details documented in ED Course. Radiology: ordered and independent interpretation performed.  Risk Prescription drug management. Drug therapy requiring intensive monitoring for toxicity. Decision regarding hospitalization.   Patient presented to the ED for evaluation of altered mental status.  EMS noted bruising around the eye.  Patient  was activated as a level 1 trauma.  Patient was unresponsive and obtunded on arrival.  No response to Narcan  he was intubated for airway protection.  Patient was pan scanned.  No signs of significant traumatic injury on his CT of his head face C-spine chest abdomen pelvis.  Labs notable for elevated lactic acid level.  Slight increase in CK but significant elevation in his ethanol level.  The ethanol is greater than 450.  His degree of alcohol intoxication may be the sole etiology of his symptoms however cannot exclude seizure  Patient is currently intubated.  He is on a propofol  drip.  Will plan on admission to the ICU.     Final diagnoses:  Alcoholic intoxication with complication  Injury of head, initial encounter    ED Discharge Orders     None          Randol Simmonds, MD 11/09/24 1458  "

## 2024-11-09 NOTE — ED Notes (Signed)
 ETOH greater than 450. MD made aware

## 2024-11-09 NOTE — Progress Notes (Signed)
 eLink Physician-Brief Progress Note Patient Name: Tyler Best DOB: 05/14/1986 MRN: [id number]   Date of Service  11/09/2024  HPI/Events of Note  Called to camera in due to agitation, calmed down after giving Fentanyl  200 mcg.  38 M ETOH abuse, found unresponsive by mom who initiated CPR as per EMS guidance. Last known well 9 pm the day before. He was seen by EMS with evidence of vomiting, eventually intubated in the ED for airway protection. Head CT negative but chest CT consistent with aspiration pneumonia started on ampicillin -sulbactam. He also received phenobarbital  and Fentanyl  in the ED.  Lactate 4.9 from 3. No fluids given yet  eICU Interventions  CIWA protocol ordered which would include Versed  prn. Phenobarbital  prn to be given as well. LR 1 liter bolus ordered and is to continue LR 100 cc/hr Received request for renewal of restraints Patient seen intubated and a risk for self harm by pulling lines and tubes Bilateral soft wrist restraints ordered Bedside team to assess in am if restraints to be continued Discussed with BSRN     Intervention Category Minor Interventions: Agitation / anxiety - evaluation and management  [name] 11/09/2024, 8:30 PM

## 2024-11-09 NOTE — ED Triage Notes (Signed)
 Pt BIB [facility] from home as a level1 trauma from home. 2199 last seen normal. Mom found him laying supine unresponsive. Pt's mother did chest compressions on him for a little bit. Known alcoholic. Assuming the pt was drinking. Spo2 dropped from 100 to 80s. Pt vomited at one point.

## 2024-11-09 NOTE — H&P (Addendum)
 "  NAME:  Tyler Best, MRN:  [id number], DOB:  1986-05-28, LOS: 0 ADMISSION DATE:  11/09/2024, CONSULTATION DATE:  12/27 REFERRING MD:  knapp, CHIEF COMPLAINT:  AMS   History of Present Illness:  38 year old male patient with history as mentioned below.  Has a very heavy history of daily alcohol consumption, intermittently will stop drinking, family reports often suffers from increased restlessness, irritability, insomnia when he stops.  Had recently stopped drinking over the holidays, was anticipating seeing his children on Christmas, apparently his previous spouse did not bring them, he started drinking once again later that evening (brother reports really does not take any significant events to trigger him to resume drinking typically).  He was found by his mother in the late morning unresponsive hanging off from a couch.  He had snoring respiratory efforts, and did not respond to stimulus.  She called her son who is a company secretary, he recommended she call EMS and she subsequently started CPR.  On EMS arrival patient remained unresponsive he did vomit.  Pulse oximetry noted to be in the 80s.  Nasal trumpet was placed and he was manually bagged and route to the emergency room. He was intubated for airway protection Diagnostic imaging in the emergency room demonstrated the following: The CT head without contrast was negative for acute hemorrhage or infarct.  Soft tissue of this skull was negative for acute findings CT maxillofacial imaging was negative for acute facial fracture cervical spine CT was negative CT chest abdomen pelvis showed findings consistent with aspiration pneumonia. Glucose 121 AST 162 ALT 124 white blood cell count and hemoglobin normal INR 1.1 total CK6 103 initial lactic acid 3 arterial blood gas 7.37 pCO2 of 44, pO2 378, HCO3 26, ethanol level greater than 450.  Blood cultures were sent, IV hydration initiated, critical care was asked to admit  Pertinent  Medical History   Hyperlipidemia, gout, heavy alcoholism with daily EtOH consumption and withdrawal  Significant Hospital Events: Including procedures, antibiotic start and stop dates in addition to other pertinent events   Admitted 12/27 after being found unresponsive alcohol level greater than 450 all CT imaging negative.  Interim History / Subjective:  Sedated on propofol   Objective    Blood pressure (!) 139/106, pulse 97, temperature (!) 96.2 F (35.7 C), temperature source Temporal, resp. rate (!) 21, height 6' (1.829 m), weight 90 kg, SpO2 100%.    Vent Mode: PRVC FiO2 (%):  [100 %] 100 % Set Rate:  [16 bmp] 16 bmp Vt Set:  [620 mL] 620 mL PEEP:  [5 cmH20] 5 cmH20 Plateau Pressure:  [19 cmH20] 19 cmH20  No intake or output data in the 24 hours ending 11/09/24 1539 Filed Weights   11/09/24 1337  Weight: 90 kg    Examination: General: Well-developed 38 year old male patient currently sedated on propofol  HENT:  normocephalic left periorbital ecchymosis, pupils equal reactive, orally intubated Lungs: Clear to auscultation currently on full ventilatory support Cardiovascular: Regular rate and rhythm without murmur rub or gallop Abdomen: Soft not tender Extremities: Warm dry brisk capillary refill Neuro: Currently on very low-dose propofol , withdrawals on the right to noxious stimulus, seems to localize with the left upper.  Pupils equal briskly reactive, good cough reflex GU: Clear yellow  Resolved problem list   Assessment and Plan   Acute toxic and possibly metabolic encephalopathy - Differential diagnosis includes acute alcohol intoxication, subclinical seizure, postictal state, or anoxic brain injury - Current CT imaging negative.  Does seem to localize on the  left withdraws on other extremities Plan Admit to the intensive care Neuroprotective interventions including the following: Head of bed elevated, ensure mean arterial pressure greater than 65, keep euglycemic, avoid fever,  correct electrolyte imbalance Has spoken to on-call neurology, will  obtain EEG Serial cbgs Given history of withdrawal in the past we will go ahead and empirically start phenobarb taper, he has mild elevated LFTs so we will follow his hepatic profile If he does not improve over the next 72 hours he will need further imaging such as MRI to rule out anoxic injury Will place him on thiamine  and folate  Acute hypoxic respiratory failure secondary to ineffective airway clearance and aspiration pneumonia Plan Continue full ventilatory support VAP bundle PAD protocol, RASS goal -1-2 and less EEG shows subclinical status at which case we may need to alter sedation regimen Obtain respiratory sample IV Unasyn  x 5 days A.m. chest x-ray  Lactic acidosis Plan Follow-up lactate  Mild elevated LFTs Plan Follow-up in a.m. Labs   CBC: Recent Labs  Lab 11/09/24 1347 11/09/24 1352 11/09/24 1427  WBC 9.4  --   --   HGB 15.2 15.3 13.6  HCT 44.6 45.0 40.0  MCV 99.8  --   --   PLT 196  --   --     Basic Metabolic Panel: Recent Labs  Lab 11/09/24 1347 11/09/24 1352 11/09/24 1427  NA 142 144 139  K 3.6 3.5 3.7  CL 100 102  --   CO2 25  --   --   GLUCOSE 121* 119*  --   BUN 7 6  --   CREATININE 0.93 1.50*  --   CALCIUM  8.5*  --   --    GFR: Estimated Creatinine Clearance: 71.9 mL/min (A) (by C-G formula based on SCr of 1.5 mg/dL (H)). Recent Labs  Lab 11/09/24 1347 11/09/24 1352  WBC 9.4  --   LATICACIDVEN  --  3.0*    Liver Function Tests: Recent Labs  Lab 11/09/24 1347  AST 162*  ALT 124*  ALKPHOS 72  BILITOT 0.4  PROT 7.3  ALBUMIN 4.9   No results for input(s): LIPASE, AMYLASE in the last 168 hours. No results for input(s): AMMONIA in the last 168 hours.  ABG    Component Value Date/Time   PHART 7.374 11/09/2024 1427   PCO2ART 43.7 11/09/2024 1427   PO2ART 378 (H) 11/09/2024 1427   HCO3 25.9 11/09/2024 1427   TCO2 27 11/09/2024 1427   O2SAT 100  11/09/2024 1427     Coagulation Profile: Recent Labs  Lab 11/09/24 1347  INR 1.1    Cardiac Enzymes: Recent Labs  Lab 11/09/24 1347  CKTOTAL 603*  CKMB 2.0    HbA1C: No results found for: HGBA1C  CBG: No results for input(s): GLUCAP in the last 168 hours.  Review of Systems:   Not able   Past Medical History:  He,  has no past medical history on file.   Surgical History:   Social History:      Family History:  His family history is not on file.   Allergies Allergies[1]   Home Medications  Prior to Admission medications  Not on File     Critical care time:  I personally  spent 45 minutes  on this patient which included: review of medical records, nursing notes, progress notes, evaluation, interpretation of lab data and diagnostic studies, taking independent history, performing exam, documenting plan, ordering diagnostics and interventions for the following critical care issues:  Acute respiratory failure, Acute toxic and or metabolic encephalopathy with the following interventions which included: titration of ventilatory support, evaluation and management of acute neurological insult                [1] Not on File  "

## 2024-11-09 NOTE — Progress Notes (Signed)
 Pt transported from ED to 92M, via ventilator, RN x2, NT, and RT at bedside. VSS throughout.

## 2024-11-09 NOTE — Progress Notes (Signed)
 LTM EEG hooked up and running - no initial skin breakdown - push button tested - Not monitored by [facility] while in ER .  MR compatible leads applied.

## 2024-11-09 NOTE — ED Notes (Signed)
 Trauma Response Nurse Documentation   Tyler Best is a 38 y.o. male arriving to [facility] ED via EMS  On No antithrombotic. Trauma was activated as a Level 1 by ED Charge RN based on the following trauma criteria GCS < 9. GCS of 3. Patient cleared for CT by Dr. Eletha. Pt transported to CT with trauma response nurse present to monitor. RN remained with the patient throughout their absence from the department for clinical observation.   GCS 3.  Trauma MD Arrival Time: 21 Dr Eletha at bedside.  History   No past medical history on file.      Initial Focused Assessment (If applicable, or please see trauma documentation): Airway: Compromised, in distress. Being bagged with BVM. Nasal trumpet in place. Abdominal muscle use.  Breathing: Breath sounds auscultated and crackles heard; presumed aspiration (pt vomited with EMS after their arrival). SpO2 70% on RA with EMS - pt being bagged en route with 100% O2 flow.  Circulation: Bilateral 18G PIVs to ACs. Bruising noted to left eye. Pulses intact throughout. SBP 140's manually.  Disability: GCS 3 -- unresponsive. Being bagged. No movement to pain upon arrival to ED. Per EMS, pt is a significant etoh user.   CT's Completed:   CT Head, CT Maxillofacial, CT C-Spine, CT Chest w/ contrast, and CT abdomen/pelvis w/ contrast   Interventions:  2mg  narcan  given without relief.  Intubated with RSI upon arrival to ED. (8.0 ETT 24 @ lip). OG tube placed (24F 70 @ lip).  Pt undressed and assessed thoroughly  Logrolled - no stepoffs or deformities noted  CXR CT pan scan  Propofol  gtt initiated.  Trauma panel drawn  Plan for disposition:  Admission to ICU   Consults completed:  none at 1500. CCM consulted due to negative images. Jeralyn Banner, NP at bedside at 1539.   Event Summary: Pt was last seen last night by his family around 2100. His mom found him today in a supine position and unresponsive.  Pt's mother did initiate chest  compressions but unclear if pt actually lost pulses. Pt is a known alcoholic. Unable to get more at this time due to pt's unresponsiveness and family not being present at this time.   Bedside handoff with ED RN Tyler Best.    Tyler Best  Trauma Response RN  Please call TRN at [phone] for further assistance.

## 2024-11-09 NOTE — Progress Notes (Signed)
 Orthopedic Tech Progress Note Patient Details:  Archibald Marchetta August 17, 1986 [id number] Level 1 Trauma  Patient ID: Arthea Glean Sar, male   DOB: Oct 21, 1986, 38 y.o.   MRN: [id number]  Massie FORBES Bar 11/09/2024, 2:06 PM

## 2024-11-09 NOTE — Progress Notes (Signed)
 eLink Physician-Brief Progress Note Patient Name: Tyler Best DOB: 24-Jul-1986 MRN: [id number]   Date of Service  11/09/2024  HPI/Events of Note  Request for foley. Bladder scan showed >668  eICU Interventions  Foley catheter insertion ordered for accurate input and output monitoring in this critically ill patient Bedside rounding team to reassess when Foley catheter can be appropriately removed     Intervention Category Intermediate Interventions: Other:  [name] 11/09/2024, 10:46 PM

## 2024-11-10 DIAGNOSIS — F10929 Alcohol use, unspecified with intoxication, unspecified: Secondary | ICD-10-CM

## 2024-11-10 DIAGNOSIS — J690 Pneumonitis due to inhalation of food and vomit: Secondary | ICD-10-CM

## 2024-11-10 DIAGNOSIS — R7401 Elevation of levels of liver transaminase levels: Secondary | ICD-10-CM

## 2024-11-10 DIAGNOSIS — J9601 Acute respiratory failure with hypoxia: Secondary | ICD-10-CM

## 2024-11-10 DIAGNOSIS — R4182 Altered mental status, unspecified: Secondary | ICD-10-CM

## 2024-11-10 DIAGNOSIS — E162 Hypoglycemia, unspecified: Secondary | ICD-10-CM

## 2024-11-10 DIAGNOSIS — Y908 Blood alcohol level of 240 mg/100 ml or more: Secondary | ICD-10-CM

## 2024-11-10 DIAGNOSIS — G928 Other toxic encephalopathy: Secondary | ICD-10-CM

## 2024-11-10 DIAGNOSIS — R569 Unspecified convulsions: Secondary | ICD-10-CM | POA: Diagnosis not present

## 2024-11-10 LAB — CBC
HCT: 34.9 % — ABNORMAL LOW (ref 39.0–52.0)
Hemoglobin: 12.1 g/dL — ABNORMAL LOW (ref 13.0–17.0)
MCH: 34.4 pg — ABNORMAL HIGH (ref 26.0–34.0)
MCHC: 34.7 g/dL (ref 30.0–36.0)
MCV: 99.1 fL (ref 80.0–100.0)
Platelets: 135 K/uL — ABNORMAL LOW (ref 150–400)
RBC: 3.52 MIL/uL — ABNORMAL LOW (ref 4.22–5.81)
RDW: 13.7 % (ref 11.5–15.5)
WBC: 8.5 K/uL (ref 4.0–10.5)
nRBC: 0 % (ref 0.0–0.2)

## 2024-11-10 LAB — URINALYSIS, ROUTINE W REFLEX MICROSCOPIC
Bilirubin Urine: NEGATIVE
Glucose, UA: NEGATIVE mg/dL
Ketones, ur: NEGATIVE mg/dL
Leukocytes,Ua: NEGATIVE
Nitrite: NEGATIVE
Protein, ur: NEGATIVE mg/dL
Specific Gravity, Urine: 1.045 — ABNORMAL HIGH (ref 1.005–1.030)
pH: 5 (ref 5.0–8.0)

## 2024-11-10 LAB — COMPREHENSIVE METABOLIC PANEL WITH GFR
ALT: 95 U/L — ABNORMAL HIGH (ref 0–44)
AST: 120 U/L — ABNORMAL HIGH (ref 15–41)
Albumin: 3.9 g/dL (ref 3.5–5.0)
Alkaline Phosphatase: 57 U/L (ref 38–126)
Anion gap: 22 — ABNORMAL HIGH (ref 5–15)
BUN: 7 mg/dL (ref 6–20)
CO2: 19 mmol/L — ABNORMAL LOW (ref 22–32)
Calcium: 8.1 mg/dL — ABNORMAL LOW (ref 8.9–10.3)
Chloride: 101 mmol/L (ref 98–111)
Creatinine, Ser: 0.86 mg/dL (ref 0.61–1.24)
GFR, Estimated: >60 mL/min
Glucose, Bld: 70 mg/dL (ref 70–99)
Potassium: 3 mmol/L — ABNORMAL LOW (ref 3.5–5.1)
Sodium: 142 mmol/L (ref 135–145)
Total Bilirubin: 0.4 mg/dL (ref 0.0–1.2)
Total Protein: 5.7 g/dL — ABNORMAL LOW (ref 6.5–8.1)

## 2024-11-10 LAB — POCT I-STAT 7, (LYTES, BLD GAS, ICA,H+H)
Acid-base deficit: 2 mmol/L (ref 0.0–2.0)
Bicarbonate: 22.1 mmol/L (ref 20.0–28.0)
Calcium, Ion: 1.05 mmol/L — ABNORMAL LOW (ref 1.15–1.40)
HCT: 33 % — ABNORMAL LOW (ref 39.0–52.0)
Hemoglobin: 11.2 g/dL — ABNORMAL LOW (ref 13.0–17.0)
O2 Saturation: 100 %
Potassium: 3.8 mmol/L (ref 3.5–5.1)
Sodium: 140 mmol/L (ref 135–145)
TCO2: 23 mmol/L (ref 22–32)
pCO2 arterial: 36.6 mmHg (ref 32–48)
pH, Arterial: 7.39 (ref 7.35–7.45)
pO2, Arterial: 194 mmHg — ABNORMAL HIGH (ref 83–108)

## 2024-11-10 LAB — GLUCOSE, CAPILLARY
Glucose-Capillary: 143 mg/dL — ABNORMAL HIGH (ref 70–99)
Glucose-Capillary: 146 mg/dL — ABNORMAL HIGH (ref 70–99)
Glucose-Capillary: 68 mg/dL — ABNORMAL LOW (ref 70–99)
Glucose-Capillary: 71 mg/dL (ref 70–99)
Glucose-Capillary: 82 mg/dL (ref 70–99)
Glucose-Capillary: 86 mg/dL (ref 70–99)

## 2024-11-10 LAB — URINE DRUG SCREEN
Amphetamines: NEGATIVE
Barbiturates: POSITIVE — AB
Benzodiazepines: POSITIVE — AB
Cocaine: NEGATIVE
Fentanyl: POSITIVE — AB
Methadone Scn, Ur: NEGATIVE
Opiates: NEGATIVE
Tetrahydrocannabinol: NEGATIVE

## 2024-11-10 LAB — MAGNESIUM: Magnesium: 1.7 mg/dL (ref 1.7–2.4)

## 2024-11-10 LAB — PHOSPHORUS: Phosphorus: 4.1 mg/dL (ref 2.5–4.6)

## 2024-11-10 LAB — ETHANOL: Alcohol, Ethyl (B): 117 mg/dL — ABNORMAL HIGH

## 2024-11-10 LAB — TRIGLYCERIDES: Triglycerides: 563 mg/dL — ABNORMAL HIGH

## 2024-11-10 MED ADMIN — AMPICILLIN-SULBACTAM 3 GM IVPB: 3 g | INTRAVENOUS | NDC 55150011720

## 2024-11-10 MED ADMIN — Magnesium Sulfate IV Soln 2 GM/50ML (40 MG/ML): 2 g | INTRAVENOUS | NDC 00409672911

## 2024-11-10 MED ADMIN — Potassium Chloride Inj 10 mEq/100ML: 10 meq | INTRAVENOUS | NDC 00338070948

## 2024-11-10 MED ADMIN — Phenobarbital Sodium Inj 130 MG/ML: 130 mg | INTRAVENOUS | NDC 54288013701

## 2024-11-10 MED ADMIN — Lactated Ringer's Solution: 500 mL | INTRAVENOUS | NDC 00264775000

## 2024-11-10 MED ADMIN — Phenobarbital Sodium Inj 130 MG/ML: 130 mg | INTRAVENOUS | NDC 00641047721

## 2024-11-10 MED ADMIN — Propofol IV Emul 1000 MG/100ML (10 MG/ML): 60 ug/kg/min | INTRAVENOUS | NDC 00069024801

## 2024-11-10 MED ADMIN — Propofol IV Emul 1000 MG/100ML (10 MG/ML): 50 ug/kg/min | INTRAVENOUS | NDC 23155034533

## 2024-11-10 MED ADMIN — THERA M PLUS PO TABS: 1 | NDC 1650058694

## 2024-11-10 MED ADMIN — Dexmedetomidine HCl in NaCl 0.9% IV Soln 400 MCG/100ML: 0.4 ug/kg/h | INTRAVENOUS | NDC 00338955712

## 2024-11-10 MED ADMIN — Dexmedetomidine HCl in NaCl 0.9% IV Soln 400 MCG/100ML: 0.3 ug/kg/h | INTRAVENOUS | NDC 00338955712

## 2024-11-10 MED ADMIN — Thiamine Mononitrate Tab 100 MG: 100 mg | ORAL | NDC 54629005701

## 2024-11-10 MED ADMIN — PROMETHAZINE (PHENERGAN) 12.5MG IN NS 50ML IVPB: 12.5 mg | INTRAVENOUS | NDC 00641092821

## 2024-11-10 MED ADMIN — Potassium Chloride Powder Packet 20 mEq: 20 meq | NDC 00245036089

## 2024-11-10 MED ADMIN — ORAL CARE MOUTH RINSE: 15 mL | OROMUCOSAL | NDC 99999080097

## 2024-11-10 MED ADMIN — Amoxicillin & K Clavulanate Tab 875-125 MG: 1 | ORAL | NDC 65862050301

## 2024-11-10 MED ADMIN — Chlorhexidine Gluconate Pads 2%: 6 | TOPICAL | NDC 53462070523

## 2024-11-10 MED ADMIN — Midazolam HCl Inj PF 2 MG/2ML (Base Equivalent): 2 mg | INTRAVENOUS | NDC 00409000101

## 2024-11-10 MED ADMIN — Polyethylene Glycol 3350 Oral Packet 17 GM: 17 g | NDC 00904693186

## 2024-11-10 MED ADMIN — Heparin Sodium (Porcine) Inj 5000 Unit/ML: 5000 [IU] | SUBCUTANEOUS | NDC 72572025501

## 2024-11-10 MED ADMIN — Dextrose Inj 50%: 50 mL | INTRAVENOUS | NDC 76329330201

## 2024-11-10 MED FILL — Amoxicillin & K Clavulanate Tab 875-125 MG: 1.0000 | ORAL | Qty: 1 | Status: AC

## 2024-11-10 MED FILL — Thiamine HCl Inj 100 MG/ML: 200.0000 mg | INTRAMUSCULAR | Qty: 2 | Status: AC

## 2024-11-10 MED FILL — Enoxaparin Sodium Inj Soln Pref Syr 40 MG/0.4ML: 40.0000 mg | INTRAMUSCULAR | Qty: 0.4 | Status: AC

## 2024-11-10 MED FILL — Potassium Chloride Inj 10 mEq/100ML: 10.0000 meq | INTRAVENOUS | Qty: 100 | Status: AC

## 2024-11-10 MED FILL — Potassium Chloride Powder Packet 20 mEq: 20.0000 meq | ORAL | Qty: 1 | Status: AC

## 2024-11-10 MED FILL — Folic Acid Tab 1 MG: 1.0000 mg | ORAL | Qty: 1 | Status: AC

## 2024-11-10 MED FILL — Polyethylene Glycol 3350 Oral Packet 17 GM: 17.0000 g | ORAL | Qty: 1 | Status: AC

## 2024-11-10 MED FILL — Dexmedetomidine HCl in NaCl 0.9% IV Soln 400 MCG/100ML: 0.0000 ug/kg/h | INTRAVENOUS | Qty: 100 | Status: AC

## 2024-11-10 MED FILL — Thiamine Mononitrate Tab 100 MG: 100.0000 mg | ORAL | Qty: 1 | Status: AC

## 2024-11-10 MED FILL — Docusate Sodium Liquid 150 MG/15ML: 100.0000 mg | ORAL | Qty: 10 | Status: AC

## 2024-11-10 MED FILL — Multiple Vitamins w/ Minerals Tab: 1.0000 | ORAL | Qty: 1 | Status: AC

## 2024-11-10 MED FILL — Dextrose Inj 50%: 1.0000 | INTRAVENOUS | Qty: 50 | Status: AC

## 2024-11-10 MED FILL — Ondansetron HCl Inj 4 MG/2ML (2 MG/ML): INTRAMUSCULAR | Qty: 2 | Status: CN

## 2024-11-10 MED FILL — Magnesium Sulfate IV Soln 2 GM/50ML (40 MG/ML): 2.0000 g | INTRAVENOUS | Qty: 50 | Status: AC

## 2024-11-10 MED FILL — Heparin Sodium (Porcine) Inj 5000 Unit/ML: 5000.0000 [IU] | INTRAMUSCULAR | Qty: 1 | Status: AC

## 2024-11-10 MED FILL — Promethazine HCl Inj 25 MG/ML: 12.5000 mg | INTRAMUSCULAR | Qty: 0.5 | Status: AC

## 2024-11-10 NOTE — Progress Notes (Signed)
 eLink Physician-Brief Progress Note Patient Name: Tyler Best DOB: 10-13-1986 MRN: [id number]   Date of Service  11/10/2024  HPI/Events of Note  Hypotension.  Blood pressure currently 9147 with mean arterial pressure of 60.  He was on dexmedetomidine  but this has been discontinued.  eICU Interventions  Case discussed with RN.  Patient asymptomatic.  He states that he usually runs low at night.  In any case, will give a 500 cc bolus of LR to help with his blood pressure.     Intervention Category Major Interventions: Hypotension - evaluation and management  Jerilynn Berg 11/10/2024, 8:21 PM

## 2024-11-10 NOTE — Progress Notes (Signed)
 Extubated  Can have ice chips  If okay in an hour , can have a diet

## 2024-11-10 NOTE — Progress Notes (Signed)
 LTM EEG discontinued - no skin breakdown at [facility].

## 2024-11-10 NOTE — Progress Notes (Signed)
 LTM maint complete - no skin breakdown under: QE8,QE7,Q1. Patient transferred from ED to 61M last night. [facility] is now monitoring.

## 2024-11-10 NOTE — Plan of Care (Signed)
   Problem: Elimination: Goal: Will not experience complications related to urinary retention Outcome: Progressing   Problem: Safety: Goal: Ability to remain free from injury will improve Outcome: Progressing   Problem: Skin Integrity: Goal: Risk for impaired skin integrity will decrease Outcome: Progressing

## 2024-11-10 NOTE — Progress Notes (Signed)
 "  NAME:  Tyler Best, MRN:  [id number], DOB:  02/15/1986, LOS: 1 ADMISSION DATE:  11/09/2024, CONSULTATION DATE:  12/27 REFERRING MD:  knapp, CHIEF COMPLAINT:  AMS   History of Present Illness:  38 year old male patient with history as mentioned below.  Has a very heavy history of daily alcohol consumption, intermittently will stop drinking, family reports often suffers from increased restlessness, irritability, insomnia when he stops.  Had recently stopped drinking over the holidays, was anticipating seeing his children on Christmas, apparently his previous spouse did not bring them, he started drinking once again later that evening (brother reports really does not take any significant events to trigger him to resume drinking typically).  He was found by his mother in the late morning unresponsive hanging off from a couch.  He had snoring respiratory efforts, and did not respond to stimulus.  She called her son who is a company secretary, he recommended she call EMS and she subsequently started CPR.  On EMS arrival patient remained unresponsive he did vomit.  Pulse oximetry noted to be in the 80s.  Nasal trumpet was placed and he was manually bagged and route to the emergency room. He was intubated for airway protection Diagnostic imaging in the emergency room demonstrated the following: The CT head without contrast was negative for acute hemorrhage or infarct.  Soft tissue of this skull was negative for acute findings CT maxillofacial imaging was negative for acute facial fracture cervical spine CT was negative CT chest abdomen pelvis showed findings consistent with aspiration pneumonia. Glucose 121 AST 162 ALT 124 white blood cell count and hemoglobin normal INR 1.1 total CK6 103 initial lactic acid 3 arterial blood gas 7.37 pCO2 of 44, pO2 378, HCO3 26, ethanol level greater than 450.  Blood cultures were sent, IV hydration initiated, critical care was asked to admit  Pertinent  Medical History   Hyperlipidemia, gout, heavy alcoholism with daily EtOH consumption and withdrawal  Significant Hospital Events: Including procedures, antibiotic start and stop dates in addition to other pertinent events   Admitted 12/27 after being found unresponsive alcohol level greater than 450 all CT imaging negative. 12/27-intubated for airway protection/encephalopathy  Interim History / Subjective:  Remains on sedation No overnight events He is waking up, slightly agitated  Objective    Blood pressure 106/67, pulse (!) 57, temperature 98.4 F (36.9 C), temperature source Oral, resp. rate 16, height 6' (1.829 m), weight 90 kg, SpO2 100%.    Vent Mode: PRVC FiO2 (%):  [40 %-100 %] 40 % Set Rate:  [16 bmp] 16 bmp Vt Set:  [620 mL] 620 mL PEEP:  [5 cmH20] 5 cmH20 Plateau Pressure:  [13 cmH20-19 cmH20] 13 cmH20   Intake/Output Summary (Last 24 hours) at 11/10/2024 9090 Last data filed at 11/10/2024 9258 Gross per 24 hour  Intake 2832.23 ml  Output 100 ml  Net 2732.23 ml   Filed Weights   11/09/24 1337  Weight: 90 kg    Examination: General: Middle-age, does not appear to be in distress  HENT: Normocephalic, periorbital ecchymosis, pupils equal and reacting Lungs: Clear breath sounds bilaterally Cardiovascular: S1-S2 appreciated Abdomen: Soft, nontender Extremities: Skin is warm and dry Neuro: Moving all extremities  GU: Clear yellow  I reviewed last 24 h vitals and pain scores, last 48 h intake and output, last 24 h labs and trends, and last 24 h imaging results. CT with right lower lobe infiltrate  Resolved problem list   Assessment and Plan   Acute toxic  metabolic encephalopathy Alcohol intoxication Concern for postictal state/seizure-has EEG monitoring in place -If status likely is related to alcohol intoxication -Will need monitoring for withdrawals as he has had withdrawals previously  Acute hypoxemic respiratory failure secondary to aspiration pneumonia -Continue  mechanical ventilation -Target TVol 6-8cc/kgIBW -Target Plateau Pressure < 30cm H20 -Target driving pressure less than 15 cm of water -Target PaO2 55-65: titrate PEEP/FiO2 per protocol -Ventilator associated pneumonia prevention protocol  Aspiration pneumonia - Currently on Unasyn  - Will continue the same  Transaminitis - Will follow  Ethanol level greater than 450 - Will recheck today  Hypoglycemia - Will monitor -Received D50  Will follow-up on EEG monitoring  He is waking up tries to be interactive but gets agitated May be able to extubate today  Updated family members at bedside  Labs   CBC: Recent Labs  Lab 11/09/24 1347 11/09/24 1352 11/09/24 1427 11/10/24 0041 11/10/24 0539  WBC 9.4  --   --  8.5  --   HGB 15.2 15.3 13.6 12.1* 11.2*  HCT 44.6 45.0 40.0 34.9* 33.0*  MCV 99.8  --   --  99.1  --   PLT 196  --   --  135*  --     Basic Metabolic Panel: Recent Labs  Lab 11/09/24 1347 11/09/24 1352 11/09/24 1427 11/10/24 0041 11/10/24 0539  NA 142 144 139 142 140  K 3.6 3.5 3.7 3.0* 3.8  CL 100 102  --  101  --   CO2 25  --   --  19*  --   GLUCOSE 121* 119*  --  70  --   BUN 7 6  --  7  --   CREATININE 0.93 1.50*  --  0.86  --   CALCIUM  8.5*  --   --  8.1*  --   MG  --   --   --  1.7  --   PHOS  --   --   --  4.1  --    GFR: Estimated Creatinine Clearance: 125.3 mL/min (by C-G formula based on SCr of 0.86 mg/dL). Recent Labs  Lab 11/09/24 1347 11/09/24 1352 11/09/24 1753 11/09/24 2052 11/10/24 0041  WBC 9.4  --   --   --  8.5  LATICACIDVEN  --  3.0* 4.9* 6.2*  --     Liver Function Tests: Recent Labs  Lab 11/09/24 1347 11/10/24 0041  AST 162* 120*  ALT 124* 95*  ALKPHOS 72 57  BILITOT 0.4 0.4  PROT 7.3 5.7*  ALBUMIN 4.9 3.9   No results for input(s): LIPASE, AMYLASE in the last 168 hours. No results for input(s): AMMONIA in the last 168 hours.  ABG    Component Value Date/Time   PHART 7.390 11/10/2024 0539   PCO2ART  36.6 11/10/2024 0539   PO2ART 194 (H) 11/10/2024 0539   HCO3 22.1 11/10/2024 0539   TCO2 23 11/10/2024 0539   ACIDBASEDEF 2.0 11/10/2024 0539   O2SAT 100 11/10/2024 0539     Coagulation Profile: Recent Labs  Lab 11/09/24 1347  INR 1.1    Cardiac Enzymes: Recent Labs  Lab 11/09/24 1347  CKTOTAL 603*  CKMB 2.0    HbA1C: No results found for: HGBA1C  CBG: Recent Labs  Lab 11/09/24 2214 11/10/24 0013 11/10/24 0427 11/10/24 0748  GLUCAP 83 71 82 68*    Review of Systems:   Not able   Past Medical History:  He,  has no past medical history on file.  Surgical History:   Social History:      Family History:  His family history is not on file.   Allergies Allergies[1]    The patient is critically ill with multiple organ systems failure and requires high complexity decision making for assessment and support, frequent evaluation and titration of therapies, application of advanced monitoring technologies and extensive interpretation of multiple databases. Critical Care Time devoted to patient care services described in this note independent of APP/resident time (if applicable)  is 35 minutes.   Jennet Epley MD [facility] Personal pager: See Amion If unanswered, please page CCM On-call: #[phone]      [1] No Known Allergies  "

## 2024-11-10 NOTE — Progress Notes (Signed)
 [facility] ADULT ICU REPLACEMENT PROTOCOL   The patient does apply for the [facility] Adult ICU Electrolyte Replacment Protocol based on the criteria listed below:   1.Exclusion criteria: TCTS, ECMO, Dialysis, and Myasthenia Gravis patients 2. Is GFR >/= 30 ml/min? Yes.    Patient's GFR today is >60 3. Is SCr </= 2? Yes.   Patient's SCr is 0.86 mg/dL 4. Did SCr increase >/= 0.5 in 24 hours? No. 5.Pt's weight >40kg  Yes.   6. Abnormal electrolyte(s): K, Mag  7. Electrolytes replaced per protocol 8.  Call MD STAT for K+ </= 2.5, Phos </= 1, or Mag </= 1 Physician:  Marion Hunter BRAVO Wilfrido Luedke 11/10/2024 2:54 AM

## 2024-11-10 NOTE — Procedures (Signed)
 Extubation Procedure Note  Patient Details:   Name: Tyler Best DOB: 18-Aug-1986 MRN: [id number]   Airway Documentation:    Vent end date: 11/10/24 Vent end time: 1253   Evaluation  O2 sats: stable throughout Complications: No apparent complications Patient did tolerate procedure well. Bilateral Breath Sounds: Clear, Diminished   Yes Pt was successfully extubated with no apparent complications. Pt is able to speak and protect airway at this time. Audible cuffleak heard with Vte loss noted. Pt on 2L [place]   Germain JAYSON Mater 11/10/2024, 12:54 PM

## 2024-11-10 NOTE — Progress Notes (Signed)
 eLink Physician-Brief Progress Note Patient Name: Tyler Best DOB: [birth year] MRN: [id number]   Date of Service  11/10/2024  HPI/Events of Note  Patient started vomiting  eICU Interventions  Phenergan  12.5 mg IV q 6 prn ordered     Intervention Category Minor Interventions: Routine modifications to care plan (e.g. PRN medications for pain, fever)  Damien DASEN Andilyn Bettcher 11/10/2024, 2:19 AM

## 2024-11-10 NOTE — Procedures (Addendum)
 Patient Name: Tyler Best  MRN: [id number]  Epilepsy Attending: Arlin MALVA Krebs  Referring Physician/Provider: Merrianne Locus, MD  Duration: 11/09/2024 1659 to 11/10/2024 1001  Patient history: 38yo M with ams. EEG to evaluate for seizure  Level of alertness:  comatose/ lethargic   AEDs during EEG study: Propofol , Phenobarb  Technical aspects: This EEG study was done with scalp electrodes positioned according to the 10-20 International system of electrode placement. Electrical activity was reviewed with band pass filter of 1-70Hz , sensitivity of 7 uV/mm, display speed of 50mm/sec with a 60Hz  notched filter applied as appropriate. EEG data were recorded continuously and digitally stored.  Video monitoring was available and reviewed as appropriate.  Description: EEG showed continuous generalized 3 to 6 Hz theta-delta slowing admixed with 15 to 18 Hz beta activity distributed symmetrically and diffusely. Hyperventilation and photic stimulation were not performed.     EEG was disconnected between 11/09/2024 1955 to 11/10/2024 0736 due to technical difficulty   ABNORMALITY - Continuous slow, generalized  IMPRESSION: This study is suggestive of generalized cerebral dysfunction (encephalopathy) likely related to sedation. No seizures or epileptiform discharges were seen throughout the recording.  Lexis Potenza O Gionna Polak

## 2024-11-11 LAB — GLUCOSE, CAPILLARY
Glucose-Capillary: 106 mg/dL — ABNORMAL HIGH (ref 70–99)
Glucose-Capillary: 132 mg/dL — ABNORMAL HIGH (ref 70–99)

## 2024-11-11 LAB — HEPATIC FUNCTION PANEL
ALT: 57 U/L — ABNORMAL HIGH (ref 0–44)
AST: 57 U/L — ABNORMAL HIGH (ref 15–41)
Albumin: 3.7 g/dL (ref 3.5–5.0)
Alkaline Phosphatase: 76 U/L (ref 38–126)
Bilirubin, Direct: 0.5 mg/dL — ABNORMAL HIGH (ref 0.0–0.2)
Indirect Bilirubin: 0.6 mg/dL (ref 0.3–0.9)
Total Bilirubin: 1.1 mg/dL (ref 0.0–1.2)
Total Protein: 5.7 g/dL — ABNORMAL LOW (ref 6.5–8.1)

## 2024-11-11 MED ADMIN — Phenobarbital Tab 32.4 MG: 129.6 mg | ORAL | NDC 00904657561

## 2024-11-11 MED ADMIN — Enoxaparin Sodium Inj Soln Pref Syr 40 MG/0.4ML: 40 mg | SUBCUTANEOUS | NDC 00781324602

## 2024-11-11 MED ADMIN — Thiamine Mononitrate Tab 100 MG: 100 mg | ORAL | NDC 54629005701

## 2024-11-11 MED ADMIN — THERA M PLUS PO TABS: 1 | ORAL | NDC 1650058694

## 2024-11-11 MED ADMIN — Docusate Sodium Liquid 150 MG/15ML: 100 mg | ORAL | NDC 00904727966

## 2024-11-11 MED ADMIN — Amoxicillin & K Clavulanate Tab 875-125 MG: 1 | ORAL | NDC 65862050301

## 2024-11-11 MED ADMIN — Calcium Carbonate (Antacid) Chew Tab 500 MG: 200 mg | ORAL | NDC 66553000401

## 2024-11-11 MED ADMIN — Chlorhexidine Gluconate Pads 2%: 6 | TOPICAL | NDC 53462070523

## 2024-11-11 MED ADMIN — Folic Acid Tab 1 MG: 1 mg | ORAL | NDC 70752023111

## 2024-11-11 MED FILL — Calcium Carbonate (Antacid) Chew Tab 500 MG: 200.0000 mg | ORAL | Qty: 1 | Status: AC

## 2024-11-11 MED FILL — Phenobarbital Tab 32.4 MG: 129.6000 mg | ORAL | Qty: 4 | Status: AC

## 2024-11-11 NOTE — Progress Notes (Signed)
 eLink Physician-Brief Progress Note Patient Name: Tyler Best DOB: 1986/09/17 MRN: [id number]   Date of Service  11/11/2024  HPI/Events of Note  Patient requesting Tums for heartburn.  eICU Interventions  Order placed.     Intervention Category Minor Interventions: Routine modifications to care plan (e.g. PRN medications for pain, fever)  Jerilynn Berg 11/11/2024, 12:28 AM

## 2024-11-11 NOTE — TOC Initial Note (Addendum)
 Transition of Care [facility]) - Initial/Assessment Note    Patient Details  Name: Tyler Best MRN: [id number] Date of Birth: 09-Sep-1986  [facility] CM/SW Contact:    Justina Delcia Czar, RN Phone Number: [phone] 11/11/2024, 5:04 PM  Clinical Narrative:                 Spoke to pt and states he is currently living with parents. States he has been to drug rehab in the past. States he wants to quit alcohol and has did AA/NA in the past. Discussed resources for drug rehab and recovery. Drug resources and contact numbers on AVS.   Needs PCP appt. Message sent to CMA basket to arrange appt.   Expected Discharge Plan: Home/Self Care Barriers to Discharge: Continued Medical Work up   Patient Goals and CMS Choice Patient states their goals for this hospitalization and ongoing recovery are:: wants to recover and get better          Expected Discharge Plan and Services   Discharge Planning Services: CM Consult   Living arrangements for the past 2 months: Single Family Home                                      Prior Living Arrangements/Services Living arrangements for the past 2 months: Single Family Home Lives with:: Parents Patient language and need for interpreter reviewed:: Yes Do you feel safe going back to the place where you live?: Yes      Need for Family Participation in Patient Care: No (Comment) Care giver support system in place?: No (comment)   Criminal Activity/Legal Involvement Pertinent to Current Situation/Hospitalization: No - Comment as needed  Activities of Daily Living      Permission Sought/Granted Permission sought to share information with : Case Manager, Family Supports, PCP Permission granted to share information with : Yes, Verbal Permission Granted  Share Information with NAME: Gavinn Collard  Permission granted to share info w AGENCY: PCP  Permission granted to share info w Relationship: mother  Permission granted  to share info w Contact Information: [phone]  Emotional Assessment Appearance:: Appears stated age Attitude/Demeanor/Rapport: Engaged Affect (typically observed): Accepting Orientation: : Oriented to Self, Oriented to Place, Oriented to  Time, Oriented to Situation   Psych Involvement: No (comment)  Admission diagnosis:  Injury of head, initial encounter [S09.90XA] Alcoholic intoxication with complication [F10.929] Acute metabolic encephalopathy [G93.41] Patient Active Problem List   Diagnosis Date Noted   Acute metabolic encephalopathy 11/09/2024   PCP:  Default, Provider, MD Pharmacy:   [id number] [address] [place], [place] - [address] [place] [address] Phone: [phone] Fax: [phone]     Social Drivers of Health (SDOH) Social History:   SDOH Interventions:     Readmission Risk Interventions     No data to display

## 2024-11-11 NOTE — TOC CAGE-AID Note (Signed)
 Transition of Care [facility]) - CAGE-AID Screening   Patient Details  Name: Tyler Best MRN: [id number] Date of Birth: 1986-04-09  [facility]) CM/SW Contact:    Lauraine FORBES Saa, LCSWA Phone Number: 11/11/2024, 4:34 PM   Clinical Narrative:  4:34 PM CSW attempted twice to speak with patient regarding substance use consult. Patient was asleep both time. CSW added substance use resources to patient's AVS.   CAGE-AID Screening: Substance Abuse Screening unable to be completed due to: : Patient unable to participate             Substance Abuse Education Offered: Yes

## 2024-11-11 NOTE — Discharge Instructions (Signed)
 [employer] Guide Inpatient Behavioral Health/Residential Substance Abuse Treatment - Adults The [employer]'s "U5235577" is a great source of information about community services available.  Access by dialing 2-1-1 from anywhere in [place] , or by website -  PooledIncome.pl.    (Updated 02/2016)   Crisis Assistance 24 hours a day     Services Offered       Area [employer] 24-hour crisis assistance: [phone] [place], [place]  [facility] 24-hour crisis assistance:[phone] [place], [place]  [facility] 24-hour crisis assistance: [phone] [place], [place]  [facility] Access to Care Line 24-hour crisis assistance; [phone] All  [facility] 24-hour crisis response line: 630-336-9881 All    Other Local Resources (Updated 11/2015)   Inpatient Behavioral Health/Residential Substance Abuse Treatment Programs     Services          Address and Phone Number  [facility] ([facility])   14-day residential rehabilitation  [phone] 100 [address] [place], [place]  [facility] ([facility])    Detox - private pay only 14-day residential rehabilitation -  Medicaid, insurance, private pay only [phone], or [phone] [address] [place], [place] [address]   [facility] only Multiple facilities [phone] admissions    [facility] ([facility])   90-day program Must be homeless to participate   [phone], or [phone] Daniel Mcalpine, [facility]  [facility] only [phone], or  [phone] [address] [place], [place] [address]  [facility] Residential Treatment Services Must make an appointment Accepts private pay, Ursula Mosses [facility] [phone]  [address] [place], [place] [address]   [facility] Females only Associated with the [facility] [phone] [address] [place], [place] [address]  [facility] only [phone], or [phone] [address] [place], [place]  [facility]     Detox Residential rehabilitation Private insurance only Multiple locations [phone] admissions  [facility]     Private pay Private insurance [phone] [address] [place], [place] [address]  [facility]     Males only Fee required at time of admission [phone] [address] [place], [place] [address]  [facility]     Private pay only   [phone] [address] [place], [place]  RTS (Residential Firefighter)    Detox - private pay, Medicaid Residential rehabilitation for males  - Medicare, Medicaid, insurance, private pay [phone] [address] [place], [place]   [facility]              Walk-in interviews Monday - Saturday from 8 am - 4 pm Individuals with legal charges are not eligible [phone] [address] [place], [place] [address]  [facility]  Must be willing to work Must attend Alcoholics Anonymous meetings [phone] [address] [place], [place]   [facility] [employer]    Faith-based program Private pay only [phone] [address] [place], [place]                                        Outpatient Substance Abuse  Treatment- uninsured  Narcotics Anonymous 24-HOUR HELPLINE Pre-recorded for Meeting Schedules [facility] [phone]  [email or web address] ALCOHOLICS ANONYMOUS  [place]    Answering Service [phone] Please Note: All [place]  Meetings are Non-smoking FindSpice.es  Alcohol and Drug Services -  Insurance: [employer] /State funding/private insurance Methadone, suboxone/Intensive outpatient  [place]   [phone] Fax: [phone] [address] [place], [place], [address] [place] [phone] Fax: [phone]    [address] [place], [place], [address] ([address] [place], [place], [place], [place], [place], [place], [place], [place]) [facility] [email or web address]/ Accepts State funding/Medicaid Transitional housing, Intensive Outpatient Treatment, Outpatient treatment, Veterans Services  Phone: [phone] Fax: [phone] Address: [address], [place] [address]   [employer] of Care (http://[email or web address].info/) Insurance: Medicaid Case Management, Administrator, arts, Medication Management, Outpatient Therapy, Psychosocial Rehabilitation, Substance Abuse Intensive Outpatient  Phone: [phone] Fax: [phone] [address], [place], [place], [address]  [facility] Medicaid, most private insurance providers Types of Program: Individual/Group Therapy, Substance Abuse Treatment  Phone: [place] [phone] Fax: [phone] [address], [place], [place], [address] [place] [phone] [address], [address] [place], [place], [address]  [facility]  Medicaid Types of Program: SAIOP  Phone: [phone] Fax: [phone] [address] [place], [place], [place], [address] [facility] Medicaid/state funds Crisis line [phone] [facility] [phone] [place] [phone] [place] [phone]  [facility] [address];  [place], [place] [address] [phone]  Substance Abuse Intensive Outpatient Program [facility] [address] [place], [place] [address] [phone]- Substance abuse treatment  Successful Transitions  Insurance: [facility], [address], sliding scale Types of Program: substance abuse treatment, transportation assistance Phone: [phone] Fax: [phone] Address: [address], [place] [address] [facility] ([email or web address]) Insurance: UHC, [place], [place], [place] of  [place] Program: addiction counseling, detoxification,  Phone: [phone]  Fax: [phone] Address: [address] [place], [place] [address]  [facility] (statewide facilities/programs) [address] (Medicaid/state funds) [place], [place] [address]                      http://barrett.[email or web address]/ [phone] Daniel mcalpine- [phone] [place]- [phone] [facility] [place] (2 Locations) (Medicaid/state funds) --[address]  walk in 8:30-12 and 1-2:30 [place], [id number]   [facility]- [phone] --[address] [place], [place] [address]  [phone] walk in 8:30-12 and 2-3:30  [facility] state funds/medicaid [address] [place], [place] [address] [id number] [facility] (Suboxone clinic) Medicaid/state funds  [address]  [place], [place] [address] [phone]   [facility]  [address] [place], [place] [address]  [phone] (24 hours) [place]- [address] [place], [place] [address]  [phone] (24 hours) [place]- [address] [place] [phone] [place]- [address] [place] [phone] [place] [address] [place] [phone] [facility]- Medicaid and state funds  [place]- [address] [place], [place] [address] [phone] (24 hours) [place]- [address] [place], [place] [address] [phone] [facility]- [address] [place], [place] [address] [phone] (24 hours) [place] [address] [place], [place]  [address] [phone] [place]- [address] [place] [phone]

## 2024-11-11 NOTE — Plan of Care (Signed)

## 2024-11-11 NOTE — TOC CM/SW Note (Signed)
 Transition of Care [facility]) - Inpatient Brief Assessment   Patient Details  Name: Tyler Best MRN: [id number] Date of Birth: 1985/11/29  Transition of [facility]) CM/SW Contact:    Lauraine FORBES Saa, LCSWA Phone Number: 11/11/2024, 4:31 PM   Clinical Narrative:  4:31 PM Per chart review, patient resides at home. Patient has a PCP and insurance. Patient does not have SNF/HH/DME history. Patient's preferred pharmacy's are [facility] [address], [facility] [address] [place], and [address] [address] [place]. CSW attempted twice to speak with patient regarding substance use consult. Patient was asleep both time. CSW added substance use resources to patient's AVS. Patient's SDOH needs are not on file. No other TOC needs identified at this time. TOC will continue to follow.  Transition of Care Asessment: Insurance and Status: Insurance coverage has been reviewed Patient has primary care physician: Yes Home environment has been reviewed: Private Residence Prior level of function:: N/A Prior/Current Home Services: No current home services Social Drivers of Health Review:  (N/A) Readmission risk has been reviewed: Yes Transition of care needs: no transition of care needs at this time

## 2024-11-11 NOTE — Progress Notes (Signed)
 "  NAME:  Tyler Best, MRN:  [id number], DOB:  Feb 02, 1986, LOS: 2 ADMISSION DATE:  11/09/2024, CONSULTATION DATE:  12/27 REFERRING MD:  knapp, CHIEF COMPLAINT:  AMS   History of Present Illness:  38 year old male patient with history as mentioned below.  Has a very heavy history of daily alcohol consumption, intermittently will stop drinking, family reports often suffers from increased restlessness, irritability, insomnia when he stops.  Had recently stopped drinking over the holidays, was anticipating seeing his children on Christmas, apparently his previous spouse did not bring them, he started drinking once again later that evening (brother reports really does not take any significant events to trigger him to resume drinking typically).  He was found by his mother in the late morning unresponsive hanging off from a couch.  He had snoring respiratory efforts, and did not respond to stimulus.  She called her son who is a company secretary, he recommended she call EMS and she subsequently started CPR.  On EMS arrival patient remained unresponsive he did vomit.  Pulse oximetry noted to be in the 80s.  Nasal trumpet was placed and he was manually bagged and route to the emergency room. He was intubated for airway protection Diagnostic imaging in the emergency room demonstrated the following: The CT head without contrast was negative for acute hemorrhage or infarct.  Soft tissue of this skull was negative for acute findings CT maxillofacial imaging was negative for acute facial fracture cervical spine CT was negative CT chest abdomen pelvis showed findings consistent with aspiration pneumonia. Glucose 121 AST 162 ALT 124 white blood cell count and hemoglobin normal INR 1.1 total CK6 103 initial lactic acid 3 arterial blood gas 7.37 pCO2 of 44, pO2 378, HCO3 26, ethanol level greater than 450.  Blood cultures were sent, IV hydration initiated, critical care was asked to admit.  Pertinent  Medical History   Hyperlipidemia, gout, heavy alcoholism with daily EtOH consumption and withdrawal  Significant Hospital Events: Including procedures, antibiotic start and stop dates in addition to other pertinent events   Admitted 12/27 after being found unresponsive alcohol level greater than 450 all CT imaging negative. 12/27-intubated for airway protection/encephalopathy 12/28 extubated 1  Interim History / Subjective:  Awake alert interactive Feels sore overall Denies any pain or discomfort Objective    Blood pressure (!) 96/54, pulse 61, temperature 99.5 F (37.5 C), temperature source Oral, resp. rate 13, height 6' (1.829 m), weight 90 kg, SpO2 97%.    Vent Mode: PSV;CPAP FiO2 (%):  [40 %] 40 % Set Rate:  [16 bmp] 16 bmp Vt Set:  [379 mL] 620 mL PEEP:  [5 cmH20] 5 cmH20 Pressure Support:  [5 cmH20-8 cmH20] 5 cmH20   Intake/Output Summary (Last 24 hours) at 11/11/2024 0731 Last data filed at 11/11/2024 0600 Gross per 24 hour  Intake 3376.37 ml  Output 1675 ml  Net 1701.37 ml   Filed Weights   11/09/24 1337  Weight: 90 kg    Examination: General: Middle-age, does not appear to be in distress HENT: Normocephalic, atraumatic Lungs: Clear breath sounds Cardiovascular: S1-S2 appreciated Abdomen: Soft, nontender Extremities: Skin is warm and dry Neuro:  No new labs this morning  Resolved problem list   Assessment and Plan   Acute toxic metabolic encephalopathy Alcohol intoxication Concern for postictal state/seizure Mental status did improve and was extubated 12/28 -We will and appropriate this morning with no neurological focality -Presentation likely just related to alcohol intoxication  Has history of severe alcohol withdrawals - I  do not believe he is ready for discharge at present - Will transition to phenobarb orally, taper  Hypoxemic respiratory failure due to aspiration pneumonia - This is resolved  Aspiration pneumonia - Will continue Augmentin  - Treat for  5 days  Recheck liver function test -Did have transaminitis  We will transferred to medical floor  Jennet Epley, MD [place] PCCM Pager: See Amion   "

## 2024-11-12 ENCOUNTER — Other Ambulatory Visit (HOSPITAL_COMMUNITY): Payer: Self-pay

## 2024-11-12 DIAGNOSIS — G9341 Metabolic encephalopathy: Secondary | ICD-10-CM

## 2024-11-12 LAB — GLUCOSE, CAPILLARY: Glucose-Capillary: 192 mg/dL — ABNORMAL HIGH (ref 70–99)

## 2024-11-12 MED ADMIN — Enoxaparin Sodium Inj Soln Pref Syr 40 MG/0.4ML: 40 mg | SUBCUTANEOUS | NDC 00781324602

## 2024-11-12 MED ADMIN — THERA M PLUS PO TABS: 1 | ORAL | NDC 1650058694

## 2024-11-12 MED ADMIN — Docusate Sodium Liquid 150 MG/15ML: 100 mg | ORAL | NDC 00904727966

## 2024-11-12 MED ADMIN — Amoxicillin & K Clavulanate Tab 875-125 MG: 1 | ORAL | NDC 65862050301

## 2024-11-12 MED ADMIN — Chlorhexidine Gluconate Pads 2%: 6 | TOPICAL | NDC 53462070523

## 2024-11-12 MED ADMIN — Folic Acid Tab 1 MG: 1 mg | ORAL | NDC 70752023111

## 2024-11-12 MED FILL — Amoxicillin & K Clavulanate Tab 875-125 MG: 1.0000 | ORAL | 3 days supply | Qty: 6 | Fill #0 | Status: AC

## 2024-11-12 NOTE — Plan of Care (Signed)

## 2024-11-12 NOTE — Progress Notes (Incomplete)
 Explained discharge instructions to patient. Reviewed follow up appointment and next medication administration times. Also reviewed education. Patient verbalized having an understanding for instructions given. All belongings are in the patient's possession to include TOC meds. IV and telemetry were removed. CCMD was notified. No other needs verbalized. Mother is in route with the patient's clothes. Will transport downstairs to the lounge to await his ride.

## 2024-11-12 NOTE — Progress Notes (Signed)
 CSW requested to call pt mother to discuss her concerns about pt ETOH use.  CSW spoke with pt and 2 friends first, pt asks that friends remain in room during this discussion.  Pt stating there are things he will do and things he won't do for treatment, nobody is going to make me.  Pt reports he has been attending [facility] somewhat recently.  He had a relapse and left the program, unclear exactly how long this has been. Pt stating his intention to return to [facility], which CSW encouraged.  Resources for outpt and residential treatment providers provided.  Pt does give permission for CSW to call his mother.  CSW then spoke with pt mother, who is appropriately concerned about pt ETOH use, was hoping he would enter residential treatment.  Discussed what pt intentions were regarding returning to [facility].  She is intending to have him find other housing soon but is fine with him returning home at [place] to make plans. All questions answered. Cathlyn Ferry, MSW, LCSW 12/30/20254:21 PM

## 2024-11-12 NOTE — Evaluation (Signed)
 Physical Therapy Evaluation & Discharge Patient Details Name: Tyler Best MRN: [id number] DOB: 1986/01/08 Today's Date: 11/12/2024  History of Present Illness  Pt is a 38 y.o. male who presented 11/09/24 after being found unresponsive by his mother who started CPR. Admitted with acute toxic metabolic encephalopathy, alcohol intoxication, concern for postictal state/seizure, and aspiration PNA. ETT 12/27-12/28. PMH: EtOH abuse   Clinical Impression  Pt presents with condition above. At baseline, pt is independent and lives with his parents in a split-level house. Currently, the pt is reporting abdominal, R knee, and L toe pain (reporting fracturing his L little toe ~1 week PTA). The R quads was noted to have a trigger point distally where pt was reporting the pain. Educated pt to stretch and heat/ice and massage the muscle as needed/tolerated. He is currently mobilizing mod I without AD with a mild intermittent sway/stagger, but he always recovered on his own with reactional strategies. He will likely progress quickly and well as he mobilizes more frequently. Encouraged him to walk the halls as tolerated, but if he begins to get tremulous or dizzy or have withdrawal symptoms he should call nursing and wait for them to assist him. Provided pt with IS and educated him on use. He demonstrated understanding. Discussed alcohol cessation and potentially talking with a trained psychologist to assist him in managing this. Pt appreciative of discussion and assistance. The pt does not need any further skilled PT services at this time. All education completed and questions answered. Will sign off.      If plan is discharge home, recommend the following:  (N/A)   Can travel by private vehicle        Equipment Recommendations None recommended by PT  Recommendations for Other Services       Functional Status Assessment Patient has had a recent decline in their functional status and demonstrates the  ability to make significant improvements in function in a reasonable and predictable amount of time.     Precautions / Restrictions Precautions Precautions:  (low fall risk) Restrictions Weight Bearing Restrictions Per Provider Order: No      Mobility  Bed Mobility Overal bed mobility: Modified Independent             General bed mobility comments: HOB elevated 18', no assistance needed    Transfers Overall transfer level: Independent Equipment used: None               General transfer comment: No LOB or assistance needed    Ambulation/Gait Ambulation/Gait assistance: Modified independent (Device/Increase time) Gait Distance (Feet): 400 Feet Assistive device: None Gait Pattern/deviations: Step-through pattern, Drifts right/left Gait velocity: WFL     General Gait Details: Pt with intermittent lateral dirft/sway and x1 stagger, but pt recovered on his own, reports it is due to L knee pain and R toe pain (recent fx per pt). No assistance needed and no LOB with head turns/nods, speed changes, skipping, and direction changes.  Stairs Stairs: Yes Stairs assistance: Modified independent (Device/Increase time) Stair Management: One rail Right, One rail Left, Alternating pattern Number of Stairs: 10 General stair comments: Ascends with R rail and descends with L. No LOB. Slower descending due to R knee pain and L toe pain  Wheelchair Mobility     Tilt Bed    Modified Rankin (Stroke Patients Only)       Balance  Pertinent Vitals/Pain Pain Assessment Pain Assessment: Faces Faces Pain Scale: Hurts a little bit Pain Location: abdomen, L knee, R toe (small toe fx about x1 week PTA per pt) Pain Descriptors / Indicators: Discomfort, Guarding, Grimacing, Sore Pain Intervention(s): Limited activity within patient's tolerance, Monitored during session, Repositioned    Home Living Family/patient  expects to be discharged to:: Private residence Living Arrangements: Parent (mother and father) Available Help at Discharge: Family;Available 24 hours/day Type of Home: House Home Access: Level entry     Alternate Level Stairs-Number of Steps: 5-10 Home Layout: Two level Home Equipment: Rolling Walker (2 wheels);BSC/3in1;Shower seat;Wheelchair - manual (pt's father uses them though)      Prior Function Prior Level of Function : Independent/Modified Independent             Mobility Comments: No AD       Extremity/Trunk Assessment   Upper Extremity Assessment Upper Extremity Assessment: Overall WFL for tasks assessed    Lower Extremity Assessment Lower Extremity Assessment: Overall WFL for tasks assessed;RLE deficits/detail;LLE deficits/detail RLE Deficits / Details: reports R small toe fx ~1 week PTA LLE Deficits / Details: L quads pain with noted trigger point at distal quads    Cervical / Trunk Assessment Cervical / Trunk Assessment: Normal  Communication   Communication Communication: No apparent difficulties    Cognition Arousal: Alert Behavior During Therapy: WFL for tasks assessed/performed   PT - Cognitive impairments: No apparent impairments                         Following commands: Intact       Cueing Cueing Techniques: Verbal cues     General Comments General comments (skin integrity, edema, etc.): Provided pt with IS and educated him on use. He demonstrated understanding. Discussed alcohol cessation and potentially talking with a trained psychologist to assist him in managing this. Pt appreciative of discussion and assistance.    Exercises     Assessment/Plan    PT Assessment Patient does not need any further PT services  PT Problem List         PT Treatment Interventions      PT Goals (Current goals can be found in the Care Plan section)  Acute Rehab PT Goals Patient Stated Goal: to work on not drinking so much PT Goal  Formulation: All assessment and education complete, DC therapy Time For Goal Achievement: 11/13/24 Potential to Achieve Goals: Good    Frequency       Co-evaluation               AM-PAC PT 6 Clicks Mobility  Outcome Measure Help needed turning from your back to your side while in a flat bed without using bedrails?: None Help needed moving from lying on your back to sitting on the side of a flat bed without using bedrails?: None Help needed moving to and from a bed to a chair (including a wheelchair)?: None Help needed standing up from a chair using your arms (e.g., wheelchair or bedside chair)?: None Help needed to walk in hospital room?: None Help needed climbing 3-5 steps with a railing? : None 6 Click Score: 24    End of Session   Activity Tolerance: Patient tolerated treatment well Patient left: in bed;with call bell/phone within reach (RN reported no need for bed alarm)   PT Visit Diagnosis: Unsteadiness on feet (R26.81);Pain Pain - Right/Left:  (bil) Pain - part of body: Knee;Ankle and joints of foot  Time: 9152-9083 PT Time Calculation (min) (ACUTE ONLY): 29 min   Charges:   PT Evaluation $PT Eval Low Complexity: 1 Low PT Treatments $Therapeutic Activity: 8-22 mins PT General Charges $$ ACUTE PT VISIT: 1 Visit         Theo Ferretti, PT, DPT Acute Rehabilitation Services  Office: [phone]   Theo CHRISTELLA Ferretti 11/12/2024, 9:22 AM

## 2024-11-13 DIAGNOSIS — J9601 Acute respiratory failure with hypoxia: Secondary | ICD-10-CM | POA: Insufficient documentation

## 2024-11-13 DIAGNOSIS — F10929 Alcohol use, unspecified with intoxication, unspecified: Secondary | ICD-10-CM | POA: Insufficient documentation

## 2024-11-13 DIAGNOSIS — R7401 Elevation of levels of liver transaminase levels: Secondary | ICD-10-CM | POA: Insufficient documentation

## 2024-11-13 DIAGNOSIS — J690 Pneumonitis due to inhalation of food and vomit: Secondary | ICD-10-CM | POA: Insufficient documentation

## 2024-11-13 NOTE — Hospital Course (Signed)
 Tyler Best is a 38 year old male with PMH alcohol abuse who had recurrent heavy use prior to admission.  Precipitating factor is typically life stressors which he experienced around the holidays.  He drank excessively and was found unresponsive by his mother.  EMS was called and mom had started CPR prior to their arrival.  Nasal trumpet placed and he was manually bagged and route to the ER and intubated for airway protection.  Alcohol level was greater than 450. He improved quickly and was able to be extubated on 11/10/2024 with normal mentation and tolerating oral intake adequately.  He was covered for presumed aspiration in this context and continued on Augmentin  to complete total of 5-day course. He was evaluated by social work prior to discharge as well to assist with outpatient resources.

## 2024-11-13 NOTE — Discharge Summary (Signed)
 " Physician Discharge Summary   Tyler Best FMW:[id number] DOB: [birth year] DOA: 11/09/2024  PCP: Default, Provider, MD  Admit date: 11/09/2024 Discharge date: 11/12/2024  Admitted From: Home  Disposition:  Home Discharging physician: Alm Apo, MD Barriers to discharge: none  Recommendations at discharge: Patient given outpatient resources for rehab  Discharge Condition: stable CODE STATUS: Full Diet recommendation:  Diet Orders (From admission, onward)     Start     Ordered   11/12/24 0000  Diet general        11/12/24 1316            Hospital Course: Tyler Best is a 38 year old male with PMH alcohol abuse who had recurrent heavy use prior to admission.  Precipitating factor is typically life stressors which he experienced around the holidays.  He drank excessively and was found unresponsive by his mother.  EMS was called and mom had started CPR prior to their arrival.  Nasal trumpet placed and he was manually bagged and route to the ER and intubated for airway protection.  Alcohol level was greater than 450. He improved quickly and was able to be extubated on 11/10/2024 with normal mentation and tolerating oral intake adequately.  He was covered for presumed aspiration in this context and continued on Augmentin  to complete total of 5-day course. He was evaluated by social work prior to discharge as well to assist with outpatient resources.    The patient's acute and chronic medical conditions were treated accordingly. On day of discharge, patient was felt deemed stable for discharge. Patient/family member advised to call PCP or come back to ER if needed.   Principal Diagnosis: Acute metabolic encephalopathy  Discharge Diagnoses: Active Hospital Problems   Diagnosis Date Noted   Acute metabolic encephalopathy 11/09/2024   Alcohol intoxication 11/13/2024   Acute respiratory failure with hypoxia (HCC) 11/13/2024   Aspiration pneumonia (HCC) 11/13/2024    Transaminitis 11/13/2024    Resolved Hospital Problems  No resolved problems to display.     Discharge Instructions     Diet general   Complete by: As directed    Increase activity slowly   Complete by: As directed       Allergies as of 11/12/2024   No Known Allergies      Medication List     TAKE these medications    amoxicillin -clavulanate 875-125 MG tablet Commonly known as: AUGMENTIN  Take 1 tablet by mouth 2 (two) times daily for 6 doses.   naltrexone 50 MG tablet Commonly known as: DEPADE Take 50 mg by mouth daily.   QUEtiapine 100 MG tablet Commonly known as: SEROQUEL Take 50-100 mg by mouth at bedtime as needed.   traZODone 50 MG tablet Commonly known as: DESYREL Take 50-100 mg by mouth at bedtime as needed.        Follow-up Information     Catalina Bare, MD Follow up.   Specialty: Internal Medicine Why: TIME : 9:30 AM  PLEASE ARRIVE AT 9:00 AM DATE : JANUARY 06 , 2026 TUESDAY  PLEASE BRING ALL CURRENT MEDICATION , ID and INS CARD Contact information: [address] [place] [address] [phone]                Allergies[1]  Consultations: Pulmonology  Procedures:   Discharge Exam: BP 125/76 (BP Location: Left Arm)   Pulse 70   Temp 97.8 F (36.6 C)   Resp 16   Ht 6' (1.829 m)   Wt 90 kg   SpO2 100%  BMI 26.91 kg/m  Physical Exam Constitutional:      General: He is not in acute distress.    Appearance: Normal appearance.  HENT:     Head: Normocephalic and atraumatic.     Mouth/Throat:     Mouth: Mucous membranes are moist.  Eyes:     Extraocular Movements: Extraocular movements intact.  Cardiovascular:     Rate and Rhythm: Normal rate and regular rhythm.  Pulmonary:     Effort: Pulmonary effort is normal. No respiratory distress.     Breath sounds: Normal breath sounds. No wheezing.  Abdominal:     General: Bowel sounds are normal. There is no distension.     Palpations: Abdomen is soft.      Tenderness: There is no abdominal tenderness.  Musculoskeletal:        General: Normal range of motion.     Cervical back: Normal range of motion and neck supple.  Skin:    General: Skin is warm and dry.  Neurological:     General: No focal deficit present.     Mental Status: He is alert.  Psychiatric:        Mood and Affect: Mood normal.        Behavior: Behavior normal.      The results of significant diagnostics from this hospitalization (including imaging, microbiology, ancillary and laboratory) are listed below for reference.   Microbiology: Recent Results (from the past 240 hours)  Blood culture (routine x 2)     Status: None (Preliminary result)   Collection Time: 11/09/24  5:53 PM   Specimen: BLOOD  Result Value Ref Range Status   Specimen Description BLOOD SITE NOT SPECIFIED  Final   Special Requests   Final    BOTTLES DRAWN AEROBIC AND ANAEROBIC Blood Culture adequate volume   Culture   Final    NO GROWTH 4 DAYS Performed at [facility], [address] [place], [place] [address]    Report Status PENDING  Incomplete  Blood culture (routine x 2)     Status: None (Preliminary result)   Collection Time: 11/09/24  6:01 PM   Specimen: BLOOD  Result Value Ref Range Status   Specimen Description BLOOD SITE NOT SPECIFIED  Final   Special Requests   Final    BOTTLES DRAWN AEROBIC ONLY Blood Culture results may not be optimal due to an inadequate volume of blood received in culture bottles   Culture   Final    NO GROWTH 4 DAYS Performed at [facility], [address], [place], [place] [address]    Report Status PENDING  Incomplete     Labs: BNP (last 3 results) No results for input(s): BNP in the last 8760 hours. Basic Metabolic Panel: Recent Labs  Lab 11/09/24 1347 11/09/24 1352 11/09/24 1427 11/10/24 0041 11/10/24 0539  NA 142 144 139 142 140  K 3.6 3.5 3.7 3.0* 3.8  CL 100 102  --  101  --   CO2 25  --   --  19*  --   GLUCOSE 121* 119*  --   70  --   BUN 7 6  --  7  --   CREATININE 0.93 1.50*  --  0.86  --   CALCIUM  8.5*  --   --  8.1*  --   MG  --   --   --  1.7  --   PHOS  --   --   --  4.1  --  Liver Function Tests: Recent Labs  Lab 11/09/24 1347 11/10/24 0041 11/11/24 1012  AST 162* 120* 57*  ALT 124* 95* 57*  ALKPHOS 72 57 76  BILITOT 0.4 0.4 1.1  PROT 7.3 5.7* 5.7*  ALBUMIN 4.9 3.9 3.7   No results for input(s): LIPASE, AMYLASE in the last 168 hours. No results for input(s): AMMONIA in the last 168 hours. CBC: Recent Labs  Lab 11/09/24 1347 11/09/24 1352 11/09/24 1427 11/10/24 0041 11/10/24 0539  WBC 9.4  --   --  8.5  --   HGB 15.2 15.3 13.6 12.1* 11.2*  HCT 44.6 45.0 40.0 34.9* 33.0*  MCV 99.8  --   --  99.1  --   PLT 196  --   --  135*  --    Cardiac Enzymes: Recent Labs  Lab 11/09/24 1347  CKTOTAL 603*  CKMB 2.0   BNP: Invalid input(s): POCBNP CBG: Recent Labs  Lab 11/10/24 2015 11/10/24 2340 11/11/24 0354 11/11/24 0755 11/12/24 0736  GLUCAP 143* 146* 106* 132* 192*   D-Dimer No results for input(s): DDIMER in the last 72 hours. Hgb A1c No results for input(s): HGBA1C in the last 72 hours. Lipid Profile No results for input(s): CHOL, HDL, LDLCALC, TRIG, CHOLHDL, LDLDIRECT in the last 72 hours. Thyroid  function studies No results for input(s): TSH, T4TOTAL, T3FREE, THYROIDAB in the last 72 hours.  Invalid input(s): FREET3 Anemia work up No results for input(s): VITAMINB12, FOLATE, FERRITIN, TIBC, IRON, RETICCTPCT in the last 72 hours. Urinalysis    Component Value Date/Time   COLORURINE YELLOW 11/10/2024 0400   APPEARANCEUR CLOUDY (A) 11/10/2024 0400   LABSPEC 1.045 (H) 11/10/2024 0400   PHURINE 5.0 11/10/2024 0400   GLUCOSEU NEGATIVE 11/10/2024 0400   HGBUR MODERATE (A) 11/10/2024 0400   BILIRUBINUR NEGATIVE 11/10/2024 0400   KETONESUR NEGATIVE 11/10/2024 0400   PROTEINUR NEGATIVE 11/10/2024 0400   NITRITE NEGATIVE  11/10/2024 0400   LEUKOCYTESUR NEGATIVE 11/10/2024 0400   Sepsis Labs Recent Labs  Lab 11/09/24 1347 11/10/24 0041  WBC 9.4 8.5   Microbiology Recent Results (from the past 240 hours)  Blood culture (routine x 2)     Status: None (Preliminary result)   Collection Time: 11/09/24  5:53 PM   Specimen: BLOOD  Result Value Ref Range Status   Specimen Description BLOOD SITE NOT SPECIFIED  Final   Special Requests   Final    BOTTLES DRAWN AEROBIC AND ANAEROBIC Blood Culture adequate volume   Culture   Final    NO GROWTH 4 DAYS Performed at [facility], [address] [place], [place] [address]    Report Status PENDING  Incomplete  Blood culture (routine x 2)     Status: None (Preliminary result)   Collection Time: 11/09/24  6:01 PM   Specimen: BLOOD  Result Value Ref Range Status   Specimen Description BLOOD SITE NOT SPECIFIED  Final   Special Requests   Final    BOTTLES DRAWN AEROBIC ONLY Blood Culture results may not be optimal due to an inadequate volume of blood received in culture bottles   Culture   Final    NO GROWTH 4 DAYS Performed at [facility], [address] [place], [place] [address]    Report Status PENDING  Incomplete    Procedures/Studies: Overnight EEG with video Result Date: 11/10/2024 Shelton Arlin KIDD, MD     11/10/2024  1:43 PM Patient Name: Tyler Best MRN: [id number] Epilepsy Attending: Arlin KIDD Shelton Referring  Physician/Provider: Merrianne Locus, MD Duration: 11/09/2024 1659 to 11/10/2024 1001 Patient history: 38yo M with ams. EEG to evaluate for seizure Level of alertness:  comatose/ lethargic AEDs during EEG study: Propofol , Phenobarb Technical aspects: This EEG study was done with scalp electrodes positioned according to the 10-20 International system of electrode placement. Electrical activity was reviewed with band pass filter of 1-70Hz , sensitivity of 7 uV/mm, display speed of 8mm/sec with a 60Hz  notched filter applied as  appropriate. EEG data were recorded continuously and digitally stored.  Video monitoring was available and reviewed as appropriate. Description: EEG showed continuous generalized 3 to 6 Hz theta-delta slowing admixed with 15 to 18 Hz beta activity distributed symmetrically and diffusely. Hyperventilation and photic stimulation were not performed.   EEG was disconnected between 11/09/2024 1955 to 11/10/2024 0736 due to technical difficulty ABNORMALITY - Continuous slow, generalized IMPRESSION: This study is suggestive of generalized cerebral dysfunction (encephalopathy) likely related to sedation. No seizures or epileptiform discharges were seen throughout the recording. Arlin MALVA Krebs   CT CHEST ABDOMEN PELVIS W CONTRAST Result Date: 11/09/2024 EXAM: CT CHEST, ABDOMEN AND PELVIS WITH CONTRAST 11/09/2024 02:28:24 PM TECHNIQUE: CT of the chest, abdomen and pelvis was performed with the administration of 75 mL of iohexol  (OMNIPAQUE ) 350 MG/ML injection. Multiplanar reformatted images are provided for review. Automated exposure control, iterative reconstruction, and/or weight based adjustment of the mA/kV was utilized to reduce the radiation dose to as low as reasonably achievable. COMPARISON: Chest x-ray 12:27:25 CLINICAL HISTORY: Polytrauma, blunt. FINDINGS: CHEST: MEDIASTINUM AND LYMPH NODES: Heart and pericardium are grossly unremarkable. Markedly limited evaluation of the aorta. Endotracheal tube within the trachea terminating approximately 3 to 4 cm above the carina. LUNGS AND PLEURA: Right lower lobe peribronchovascular and dependent low and high density airspace opacities. Right lower lobe aspiration likely. No pleural effusion or pneumothorax. LIMITATIONS/ARTIFACTS: Markedly limited evaluation of the chest, ribs, shoulders, and thoracic spine due to motion artifact. ABDOMEN AND PELVIS: LIVER: The liver is unremarkable. GALLBLADDER AND BILE DUCTS: Gallbladder is unremarkable. No biliary ductal dilatation.  SPLEEN: No acute abnormality. PANCREAS: No acute abnormality. ADRENAL GLANDS: No acute abnormality. KIDNEYS, URETERS AND BLADDER: No stones in the kidneys or ureters. No hydronephrosis. No perinephric or periureteral stranding. No filling defects of the partially visualized collecting systems on delayed imaging. Urinary bladder is unremarkable. GI AND BOWEL: Enteric tube with tip in side port within the gastric lumen. Question distal esophageal wall thickening. No small or large bowel thickening or dilatation. Status post appendectomy. REPRODUCTIVE ORGANS: No acute abnormality. PERITONEUM AND RETROPERITONEUM: No ascites. No free air. VASCULATURE: Aorta is normal in caliber. ABDOMINAL AND PELVIS LYMPH NODES: No lymphadenopathy. BONES AND SOFT TISSUES: No acute osseous abnormality of the abdomen and pelvis. Evaluation of the ribs and THORACIC SPINE is markedly limited due to motion artifact. No focal soft tissue abnormality. No acute fracture or dislocation of either hip. No acute displaced fracture of the lumbar spine. No traumatic malalignment of the lumbar spine. No acute fracture of the sacrum. IMPRESSION: 1. Markedly limited evaluation of the chest, ribs, shoulders, and thoracic spine due to motion artifact. 2. Right lower lobe aspiration likely. 3. Possible distal esophageal wall thickening. Correlate with signs and symptoms of esophagitis. 4. Otherwise, no definite acute traumatic injury. 5. These findings were discussed with doctor Gerkins on the phone by doctor Naveau on 11/09/24 at 2:19 pm. Electronically signed by: Morgane Naveau MD 11/09/2024 02:49 PM EST RP Workstation: [id number]   CT CERVICAL SPINE WO CONTRAST Result Date: 11/09/2024  EXAM: CT CERVICAL SPINE WITHOUT CONTRAST 11/09/2024 02:28:24 PM TECHNIQUE: CT of the cervical spine was performed without the administration of intravenous contrast. Multiplanar reformatted images are provided for review. Automated exposure control, iterative  reconstruction, and/or weight based adjustment of the mA/kV was utilized to reduce the radiation dose to as low as reasonably achievable. COMPARISON: None available. CLINICAL HISTORY: Polytrauma, blunt. FINDINGS: BONES AND ALIGNMENT: No acute fracture or traumatic malalignment. DEGENERATIVE CHANGES: No significant degenerative changes. SOFT TISSUES: Endotracheal and orogastric tube. No prevertebral soft tissue swelling. IMPRESSION: 1. No evidence of acute traumatic injury to the cervical spine. 2. Endotracheal and orogastric tubes are present. 3. These findings were discussed with doctor Gerkins on the phone by doctor Naveau on 11/09/24 at 2:30 pm Electronically signed by: Morgane Naveau MD 11/09/2024 02:44 PM EST RP Workstation: [id number]   CT MAXILLOFACIAL WO CONTRAST Result Date: 11/09/2024 EXAM: CT OF THE FACE WITHOUT CONTRAST 11/09/2024 97:71:75 PM TECHNIQUE: CT of the face was performed without the administration of intravenous contrast. Multiplanar reformatted images are provided for review. Automated exposure control, iterative reconstruction, and/or weight based adjustment of the mA/kV was utilized to reduce the radiation dose to as low as reasonably achievable. COMPARISON: None available. CLINICAL HISTORY: Facial trauma, blunt. found unresponsive FINDINGS: FACIAL BONES: No acute facial fracture. No mandibular dislocation. No suspicious bone lesion. ORBITS: Globes are intact. No acute traumatic injury. No inflammatory change. Mild right periorbital soft tissue edema. SINUSES AND MASTOIDS: Bilateral maxillary sinus mucosal thickening. Left sphenoid sinus mucosal thickening. No acute abnormality in the mastoids. SOFT TISSUES: Orogastric tube noted coursing within the esophagus. Endotracheal tube noted coursing within the trachea. Mild right periorbital soft tissue edema. No large hematoma formation. No retained radiopaque foreign body. IMPRESSION: 1. No acute facial fracture. 2. These findings were  discussed with doctor Gerkins on the phone by doctor Naveau on 11/09/24 at 2:30 pm. Electronically signed by: Morgane Naveau MD 11/09/2024 02:42 PM EST RP Workstation: [id number]   CT HEAD WO CONTRAST Result Date: 11/09/2024 EXAM: CT HEAD WITHOUT CONTRAST 11/09/2024 02:28:24 PM TECHNIQUE: CT of the head was performed without the administration of intravenous contrast. Automated exposure control, iterative reconstruction, and/or weight based adjustment of the mA/kV was utilized to reduce the radiation dose to as low as reasonably achievable. COMPARISON: None available. CLINICAL HISTORY: Head trauma, moderate-severe. FINDINGS: BRAIN AND VENTRICLES: No acute hemorrhage. No evidence of acute infarct. No hydrocephalus. No extra-axial collection. No mass effect or midline shift. ORBITS: No acute abnormality. SINUSES: Mild mucosal thickening throughout paranasal sinuses. SOFT TISSUES AND SKULL: Endotracheal and enteric tubes in place, partially included. No acute soft tissue abnormality. No skull fracture. IMPRESSION: 1. No acute intracranial abnormality. 2. Findings discussed with Dr. Eletha at 2:36 PM on 11/09/24. Electronically signed by: Donnice Mania MD 11/09/2024 02:41 PM EST RP Workstation: [id number]   DG Chest Port 1 View Result Date: 11/09/2024 CLINICAL DATA:  Level 1 trauma. EXAM: PORTABLE CHEST 1 VIEW COMPARISON:  05/26/2021. FINDINGS: The heart size and mediastinal contours are within normal limits. No consolidation, effusion, or pneumothorax is seen. An enteric tube courses over the stomach and out of the field of view. An endotracheal tube terminates 5.6 cm above the carina. No acute osseous abnormality. IMPRESSION: 1. No active disease. 2. Support apparatus as described above. Electronically Signed   By: Leita Birmingham M.D.   On: 11/09/2024 14:06     Time coordinating discharge: Over 30 minutes    Alm Apo, MD  [facility] 11/13/2024, 5:14 PM    [  1] No Known Allergies  "
